# Patient Record
Sex: Female | Born: 1935 | Race: White | Hispanic: No
Health system: Southern US, Community
[De-identification: ages and names within clinical notes are randomized; demographics above are authoritative.]

---

## 1998-12-02 ENCOUNTER — Ambulatory Visit (HOSPITAL_COMMUNITY): Admission: RE | Admit: 1998-12-02 | Discharge: 1998-12-02 | Payer: Self-pay | Admitting: Internal Medicine

## 2000-11-17 ENCOUNTER — Encounter: Payer: Self-pay | Admitting: Orthopaedic Surgery

## 2000-11-17 ENCOUNTER — Encounter: Admission: RE | Admit: 2000-11-17 | Discharge: 2000-11-17 | Payer: Self-pay | Admitting: Orthopaedic Surgery

## 2000-12-01 ENCOUNTER — Encounter: Payer: Self-pay | Admitting: Orthopaedic Surgery

## 2000-12-01 ENCOUNTER — Encounter: Admission: RE | Admit: 2000-12-01 | Discharge: 2000-12-01 | Payer: Self-pay | Admitting: Orthopaedic Surgery

## 2000-12-16 ENCOUNTER — Encounter: Payer: Self-pay | Admitting: Orthopaedic Surgery

## 2000-12-16 ENCOUNTER — Encounter: Admission: RE | Admit: 2000-12-16 | Discharge: 2000-12-16 | Payer: Self-pay | Admitting: Orthopaedic Surgery

## 2001-04-21 ENCOUNTER — Ambulatory Visit (HOSPITAL_COMMUNITY): Admission: RE | Admit: 2001-04-21 | Discharge: 2001-04-21 | Payer: Self-pay | Admitting: Internal Medicine

## 2001-07-04 ENCOUNTER — Encounter: Admission: RE | Admit: 2001-07-04 | Discharge: 2001-07-04 | Payer: Self-pay | Admitting: Orthopaedic Surgery

## 2001-07-04 ENCOUNTER — Encounter: Payer: Self-pay | Admitting: Orthopaedic Surgery

## 2001-08-03 ENCOUNTER — Encounter: Admission: RE | Admit: 2001-08-03 | Discharge: 2001-08-03 | Payer: Self-pay | Admitting: Orthopaedic Surgery

## 2001-08-03 ENCOUNTER — Encounter: Payer: Self-pay | Admitting: Orthopaedic Surgery

## 2001-08-18 ENCOUNTER — Encounter: Admission: RE | Admit: 2001-08-18 | Discharge: 2001-08-18 | Payer: Self-pay | Admitting: Orthopaedic Surgery

## 2001-08-18 ENCOUNTER — Encounter: Payer: Self-pay | Admitting: Orthopaedic Surgery

## 2002-01-10 ENCOUNTER — Encounter: Payer: Self-pay | Admitting: Orthopaedic Surgery

## 2002-01-10 ENCOUNTER — Ambulatory Visit: Admission: RE | Admit: 2002-01-10 | Discharge: 2002-01-10 | Payer: Self-pay | Admitting: Orthopaedic Surgery

## 2002-01-16 ENCOUNTER — Inpatient Hospital Stay (HOSPITAL_COMMUNITY): Admission: RE | Admit: 2002-01-16 | Discharge: 2002-01-20 | Payer: Self-pay | Admitting: Orthopaedic Surgery

## 2003-02-19 ENCOUNTER — Encounter: Admission: RE | Admit: 2003-02-19 | Discharge: 2003-02-19 | Payer: Self-pay | Admitting: Orthopaedic Surgery

## 2003-02-19 ENCOUNTER — Encounter: Payer: Self-pay | Admitting: Orthopaedic Surgery

## 2003-02-19 ENCOUNTER — Ambulatory Visit (HOSPITAL_BASED_OUTPATIENT_CLINIC_OR_DEPARTMENT_OTHER): Admission: RE | Admit: 2003-02-19 | Discharge: 2003-02-19 | Payer: Self-pay | Admitting: Orthopaedic Surgery

## 2004-05-16 ENCOUNTER — Encounter: Payer: Self-pay | Admitting: Internal Medicine

## 2004-06-02 ENCOUNTER — Encounter: Payer: Self-pay | Admitting: Orthopaedic Surgery

## 2004-06-16 ENCOUNTER — Encounter: Payer: Self-pay | Admitting: Orthopaedic Surgery

## 2004-06-16 ENCOUNTER — Encounter: Payer: Self-pay | Admitting: Internal Medicine

## 2004-07-16 ENCOUNTER — Encounter: Payer: Self-pay | Admitting: Internal Medicine

## 2004-08-16 ENCOUNTER — Encounter: Payer: Self-pay | Admitting: Internal Medicine

## 2004-09-16 ENCOUNTER — Encounter: Payer: Self-pay | Admitting: Internal Medicine

## 2004-10-14 ENCOUNTER — Encounter: Payer: Self-pay | Admitting: Internal Medicine

## 2004-11-17 ENCOUNTER — Ambulatory Visit: Payer: Self-pay | Admitting: Internal Medicine

## 2004-11-18 ENCOUNTER — Encounter: Payer: Self-pay | Admitting: Internal Medicine

## 2004-12-14 ENCOUNTER — Encounter: Payer: Self-pay | Admitting: Internal Medicine

## 2005-01-11 ENCOUNTER — Other Ambulatory Visit: Payer: Self-pay

## 2005-01-11 ENCOUNTER — Inpatient Hospital Stay: Payer: Self-pay | Admitting: Internal Medicine

## 2005-01-12 ENCOUNTER — Other Ambulatory Visit: Payer: Self-pay

## 2005-01-13 ENCOUNTER — Other Ambulatory Visit: Payer: Self-pay

## 2005-01-14 ENCOUNTER — Encounter: Payer: Self-pay | Admitting: Internal Medicine

## 2005-01-25 ENCOUNTER — Encounter: Admission: RE | Admit: 2005-01-25 | Discharge: 2005-01-25 | Payer: Self-pay | Admitting: Orthopaedic Surgery

## 2005-02-09 ENCOUNTER — Encounter: Admission: RE | Admit: 2005-02-09 | Discharge: 2005-02-09 | Payer: Self-pay | Admitting: Orthopaedic Surgery

## 2005-02-13 ENCOUNTER — Encounter: Payer: Self-pay | Admitting: Internal Medicine

## 2005-02-25 ENCOUNTER — Encounter: Admission: RE | Admit: 2005-02-25 | Discharge: 2005-02-25 | Payer: Self-pay | Admitting: Orthopaedic Surgery

## 2005-03-16 ENCOUNTER — Encounter: Payer: Self-pay | Admitting: Internal Medicine

## 2005-03-23 ENCOUNTER — Emergency Department: Payer: Self-pay | Admitting: Emergency Medicine

## 2005-03-24 ENCOUNTER — Ambulatory Visit: Payer: Self-pay | Admitting: Internal Medicine

## 2005-04-16 ENCOUNTER — Encounter: Payer: Self-pay | Admitting: Internal Medicine

## 2005-04-27 ENCOUNTER — Ambulatory Visit: Payer: Self-pay | Admitting: Internal Medicine

## 2005-05-16 ENCOUNTER — Encounter: Payer: Self-pay | Admitting: Internal Medicine

## 2005-06-11 ENCOUNTER — Ambulatory Visit: Payer: Self-pay | Admitting: Cardiology

## 2005-06-16 ENCOUNTER — Encounter: Payer: Self-pay | Admitting: Internal Medicine

## 2005-06-18 ENCOUNTER — Ambulatory Visit: Payer: Self-pay | Admitting: Cardiology

## 2005-06-18 ENCOUNTER — Ambulatory Visit: Payer: Self-pay

## 2005-06-25 ENCOUNTER — Ambulatory Visit: Payer: Self-pay | Admitting: Cardiology

## 2005-06-28 ENCOUNTER — Ambulatory Visit: Payer: Self-pay | Admitting: Cardiology

## 2005-06-28 ENCOUNTER — Encounter: Payer: Self-pay | Admitting: Cardiology

## 2005-06-28 ENCOUNTER — Ambulatory Visit (HOSPITAL_COMMUNITY): Admission: RE | Admit: 2005-06-28 | Discharge: 2005-06-28 | Payer: Self-pay | Admitting: Cardiology

## 2005-07-03 ENCOUNTER — Inpatient Hospital Stay (HOSPITAL_COMMUNITY): Admission: RE | Admit: 2005-07-03 | Discharge: 2005-07-05 | Payer: Self-pay | Admitting: Internal Medicine

## 2005-07-12 ENCOUNTER — Ambulatory Visit: Payer: Self-pay | Admitting: Cardiology

## 2005-07-16 ENCOUNTER — Encounter: Payer: Self-pay | Admitting: Internal Medicine

## 2005-08-02 ENCOUNTER — Ambulatory Visit: Payer: Self-pay | Admitting: Cardiology

## 2005-08-16 ENCOUNTER — Encounter: Payer: Self-pay | Admitting: Internal Medicine

## 2005-09-16 ENCOUNTER — Encounter: Payer: Self-pay | Admitting: Internal Medicine

## 2005-10-01 ENCOUNTER — Ambulatory Visit: Payer: Self-pay | Admitting: Cardiology

## 2005-10-14 ENCOUNTER — Encounter: Payer: Self-pay | Admitting: Internal Medicine

## 2005-11-14 ENCOUNTER — Encounter: Payer: Self-pay | Admitting: Internal Medicine

## 2005-12-14 ENCOUNTER — Encounter: Payer: Self-pay | Admitting: Internal Medicine

## 2006-01-14 ENCOUNTER — Encounter: Payer: Self-pay | Admitting: Internal Medicine

## 2006-01-31 ENCOUNTER — Ambulatory Visit: Payer: Self-pay | Admitting: Internal Medicine

## 2006-02-13 ENCOUNTER — Encounter: Payer: Self-pay | Admitting: Internal Medicine

## 2006-03-01 ENCOUNTER — Ambulatory Visit: Payer: Self-pay | Admitting: Cardiology

## 2006-03-16 ENCOUNTER — Encounter: Payer: Self-pay | Admitting: Internal Medicine

## 2006-04-13 ENCOUNTER — Ambulatory Visit: Payer: Self-pay | Admitting: Unknown Physician Specialty

## 2006-04-16 ENCOUNTER — Encounter: Payer: Self-pay | Admitting: Internal Medicine

## 2006-05-16 ENCOUNTER — Encounter: Payer: Self-pay | Admitting: Internal Medicine

## 2006-06-16 ENCOUNTER — Encounter: Payer: Self-pay | Admitting: Internal Medicine

## 2007-01-23 ENCOUNTER — Ambulatory Visit: Payer: Self-pay | Admitting: Internal Medicine

## 2007-03-01 ENCOUNTER — Ambulatory Visit: Payer: Self-pay | Admitting: Internal Medicine

## 2007-04-03 ENCOUNTER — Ambulatory Visit: Payer: Self-pay | Admitting: Internal Medicine

## 2007-06-30 ENCOUNTER — Ambulatory Visit: Payer: Self-pay | Admitting: Unknown Physician Specialty

## 2007-11-06 ENCOUNTER — Ambulatory Visit: Payer: Self-pay | Admitting: Internal Medicine

## 2007-12-06 ENCOUNTER — Ambulatory Visit: Payer: Self-pay | Admitting: Internal Medicine

## 2007-12-06 DIAGNOSIS — E0590 Thyrotoxicosis, unspecified without thyrotoxic crisis or storm: Secondary | ICD-10-CM | POA: Insufficient documentation

## 2007-12-06 DIAGNOSIS — R0602 Shortness of breath: Secondary | ICD-10-CM | POA: Insufficient documentation

## 2007-12-06 DIAGNOSIS — I10 Essential (primary) hypertension: Secondary | ICD-10-CM | POA: Insufficient documentation

## 2007-12-08 ENCOUNTER — Ambulatory Visit: Payer: Self-pay | Admitting: Internal Medicine

## 2007-12-20 ENCOUNTER — Ambulatory Visit: Payer: Self-pay | Admitting: Internal Medicine

## 2007-12-22 ENCOUNTER — Telehealth (INDEPENDENT_AMBULATORY_CARE_PROVIDER_SITE_OTHER): Payer: Self-pay | Admitting: *Deleted

## 2007-12-30 ENCOUNTER — Emergency Department: Payer: Self-pay | Admitting: Emergency Medicine

## 2007-12-30 ENCOUNTER — Other Ambulatory Visit: Payer: Self-pay

## 2008-01-02 ENCOUNTER — Telehealth (INDEPENDENT_AMBULATORY_CARE_PROVIDER_SITE_OTHER): Payer: Self-pay | Admitting: *Deleted

## 2008-01-05 ENCOUNTER — Ambulatory Visit: Payer: Self-pay | Admitting: Internal Medicine

## 2008-01-05 DIAGNOSIS — R000 Tachycardia, unspecified: Secondary | ICD-10-CM | POA: Insufficient documentation

## 2008-01-09 ENCOUNTER — Telehealth (INDEPENDENT_AMBULATORY_CARE_PROVIDER_SITE_OTHER): Payer: Self-pay | Admitting: *Deleted

## 2008-01-10 ENCOUNTER — Inpatient Hospital Stay: Payer: Self-pay | Admitting: Internal Medicine

## 2008-01-11 ENCOUNTER — Encounter: Payer: Self-pay | Admitting: Internal Medicine

## 2008-01-15 ENCOUNTER — Ambulatory Visit: Payer: Self-pay | Admitting: Internal Medicine

## 2008-01-15 ENCOUNTER — Inpatient Hospital Stay: Payer: Self-pay | Admitting: Internal Medicine

## 2008-02-26 ENCOUNTER — Ambulatory Visit: Payer: Self-pay | Admitting: Family

## 2008-11-21 ENCOUNTER — Ambulatory Visit: Payer: Self-pay | Admitting: Family

## 2009-08-21 ENCOUNTER — Ambulatory Visit: Payer: Self-pay | Admitting: Internal Medicine

## 2009-09-08 ENCOUNTER — Ambulatory Visit: Payer: Self-pay | Admitting: Internal Medicine

## 2009-12-12 ENCOUNTER — Ambulatory Visit: Payer: Self-pay | Admitting: Internal Medicine

## 2010-01-03 IMAGING — CT CT CHEST W/O CM
3 of 5 series · 17 of 36 positions shown, 19 images · non-contrast
Comparison: 07/12/2005

CLINICAL DATA: Shortness of breath, rule out pulmonary fibrosis

CT CHEST WITHOUT CONTRAST
TECHNIQUE: Multidetector CT imaging of the chest was performed
following the standard protocol without IV contrast.

[Series 2: hires chest 5.0 b40f st · axial · 0.73mm/px · z∈[-238,-28]mm · 8 of 55 slices shown, 10 images]
[im 7/55  mediastinal]
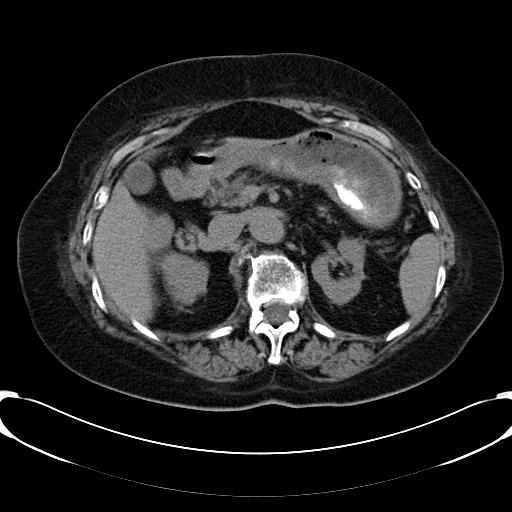
[im 7/55  lung]
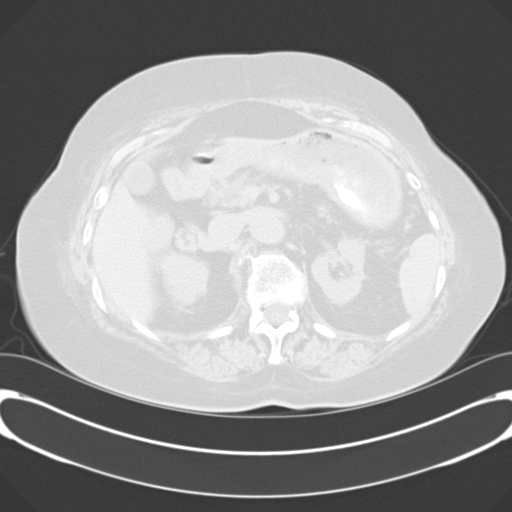
[im 13/55  lung]
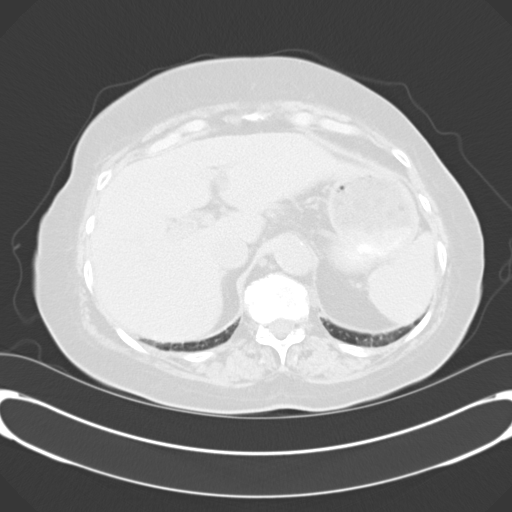
[im 19/55  lung]
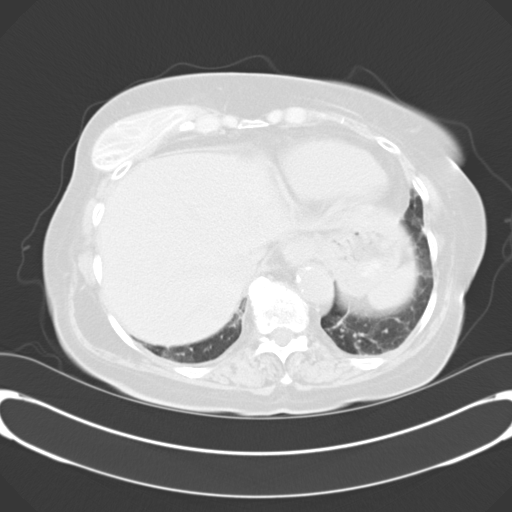
[im 25/55  lung]
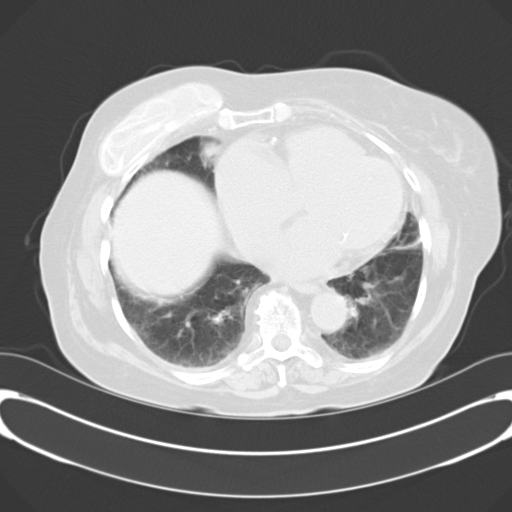
[im 31/55  mediastinal]
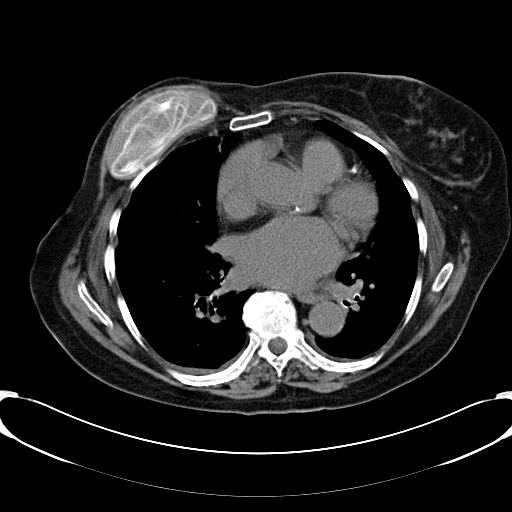
[im 31/55  lung]
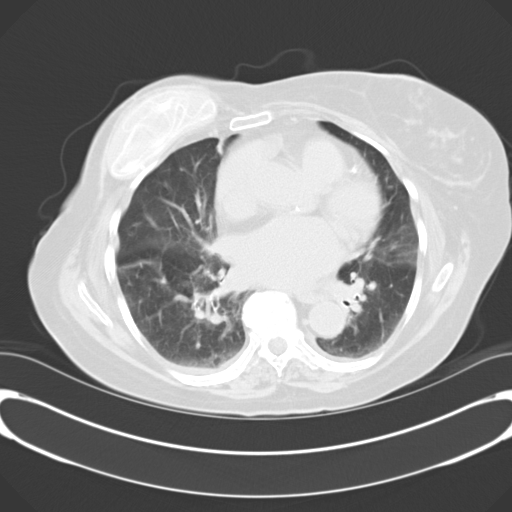
[im 37/55  lung]
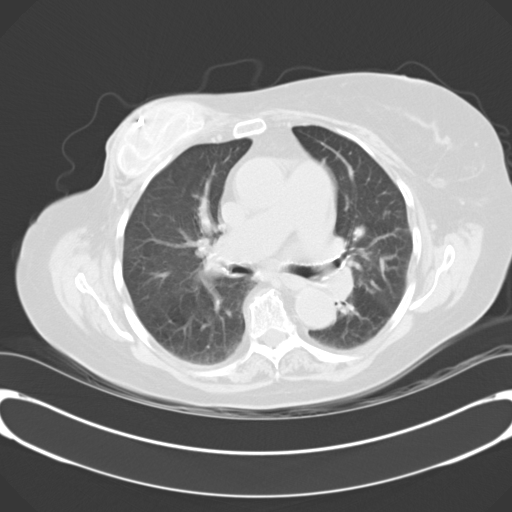
[im 43/55  lung]
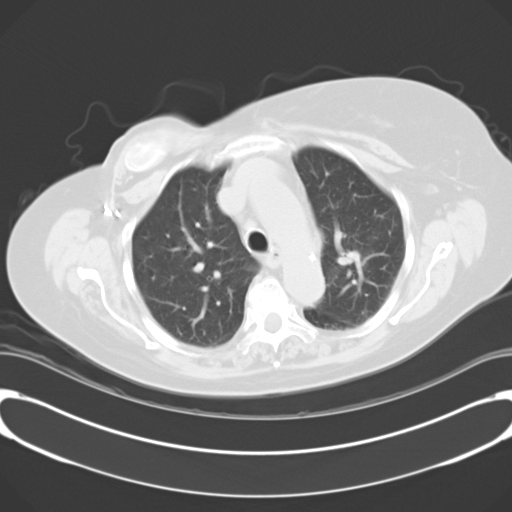
[im 49/55  lung]
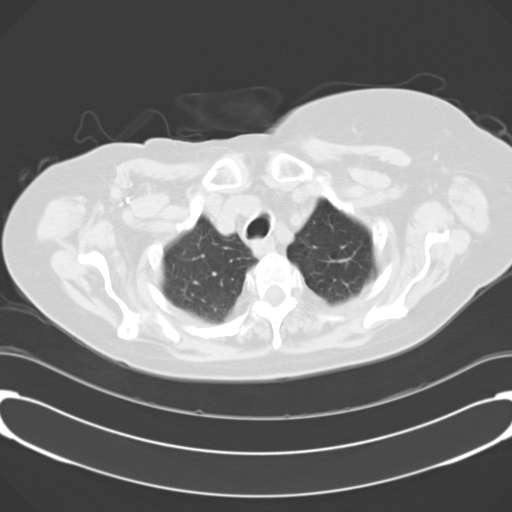

[Series 3: hires chest 5.0 b70f lung · axial · 0.73mm/px · z∈[-234,-68]mm · 6 of 54 slices shown]
[im 7/54  lung]
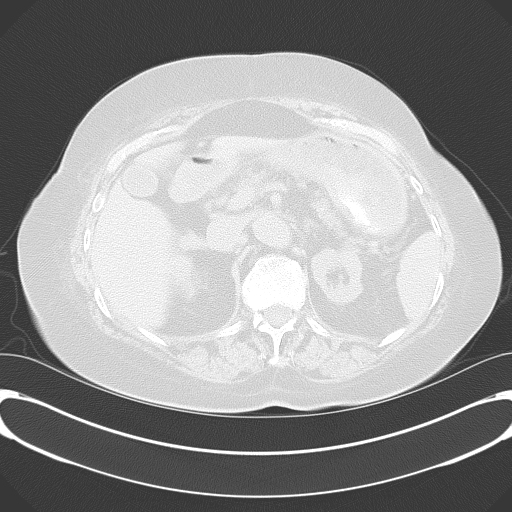
[im 14/54  lung]
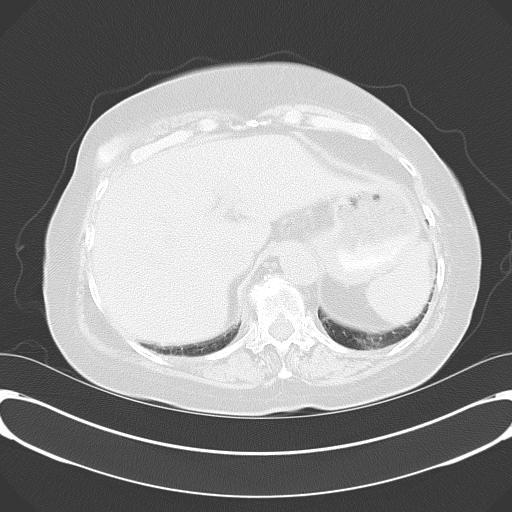
[im 20/54  lung]
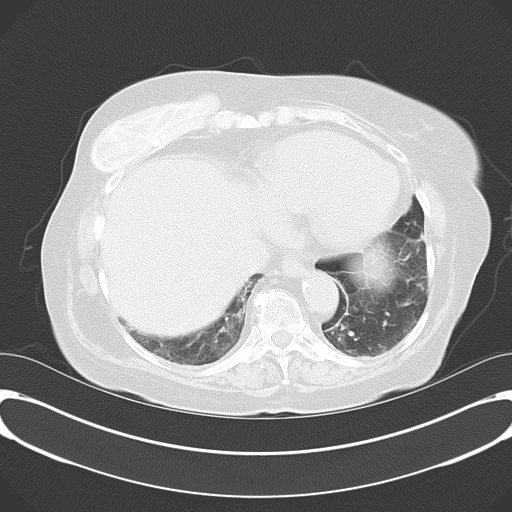
[im 27/54  lung]
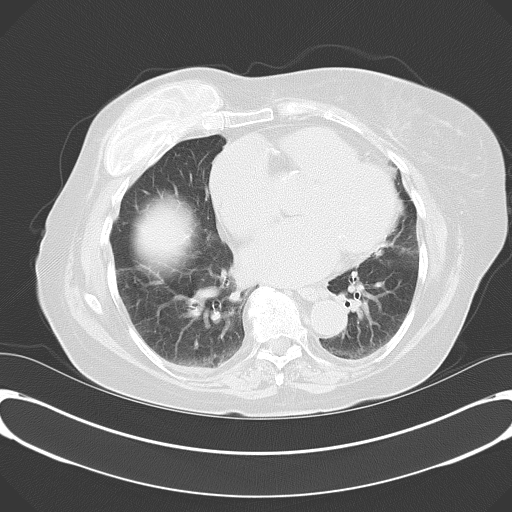
[im 34/54  lung]
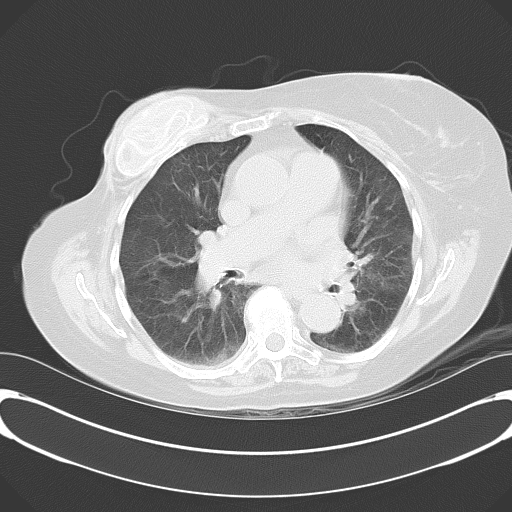
[im 40/54  lung]
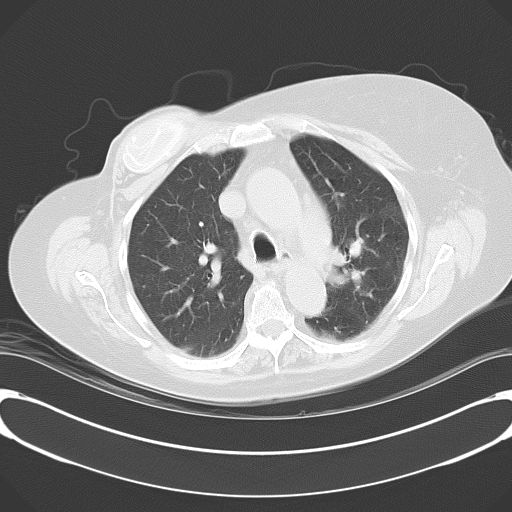

[Series 602: <mpr thick range> · coronal · 0.73mm/px · 3 of 125 slices shown]
[im 25/125  lung]
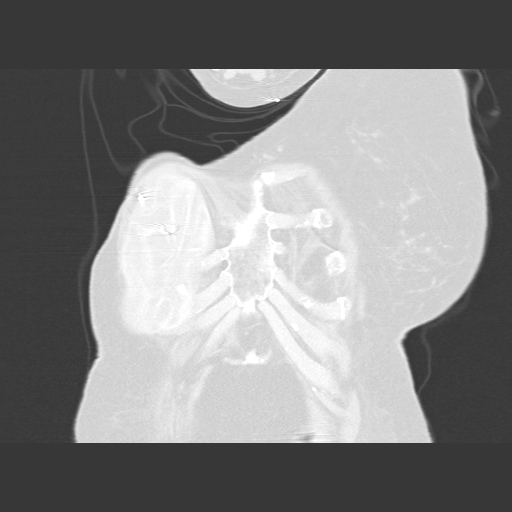
[im 50/125  lung]
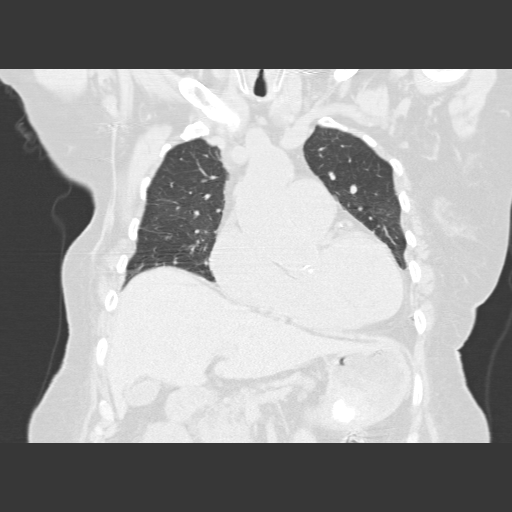
[im 75/125  lung]
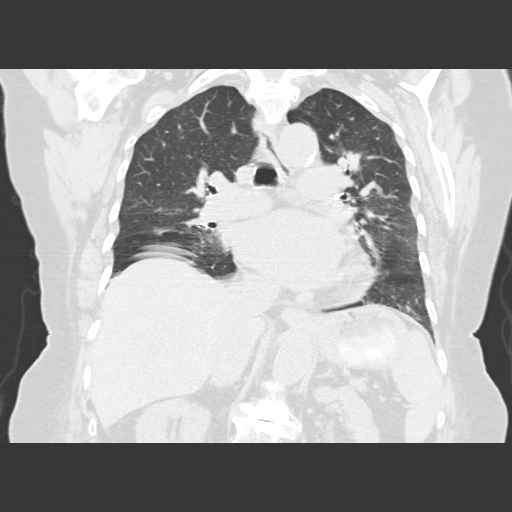

[17 of 36 positions shown; findings below may reference images not displayed]

FINDINGS: Respiratory motion somewhat obscures high resolution
images.  No well-defined fibrosis at this time.  There are
scattered areas of ground-glass densities in the posterior lingula
and in the posterior lower lobes bilaterally, possibly atelectasis.
No effusions.  No confluent opacities or nodules.

Heart is mildly enlarged.  Dense coronary artery calcifications
present.  Aorta is nondilated.

No mediastinal, hilar, or axillary adenopathy.  The patient is
status post right mastectomy and breast reconstruction and right
axillary lymph node dissection.

Imaging into the upper abdomen shows no acute findings.

Degenerate change in the thoracic spine.  No acute findings.
IMPRESSION: High resolution images somewhat obscured by respiratory motion.
Scattered predominately dependent ground-glass densities which I
suspect are likely related to atelectasis.

Status post right mastectomy and breast reconstruction and axillary
nodal dissection.

Cardiomegaly.  Coronary artery disease.

## 2010-03-24 ENCOUNTER — Encounter: Admission: RE | Admit: 2010-03-24 | Discharge: 2010-03-24 | Payer: Self-pay | Admitting: Internal Medicine

## 2010-03-31 ENCOUNTER — Ambulatory Visit: Payer: Self-pay | Admitting: Internal Medicine

## 2010-04-07 ENCOUNTER — Encounter: Admission: RE | Admit: 2010-04-07 | Discharge: 2010-04-07 | Payer: Self-pay | Admitting: Internal Medicine

## 2010-04-16 ENCOUNTER — Ambulatory Visit: Payer: Self-pay | Admitting: Internal Medicine

## 2010-04-30 ENCOUNTER — Ambulatory Visit: Payer: Self-pay | Admitting: Internal Medicine

## 2010-05-04 ENCOUNTER — Ambulatory Visit: Payer: Self-pay | Admitting: Internal Medicine

## 2010-05-16 ENCOUNTER — Emergency Department: Payer: Self-pay | Admitting: Emergency Medicine

## 2010-05-16 ENCOUNTER — Ambulatory Visit: Payer: Self-pay | Admitting: Internal Medicine

## 2010-05-18 ENCOUNTER — Ambulatory Visit: Payer: Self-pay | Admitting: Internal Medicine

## 2010-05-18 LAB — CANCER ANTIGEN 27.29: CA 27.29: 31.2 U/mL (ref 0.0–38.6)

## 2010-05-18 LAB — CEA: CEA: 2.6 ng/mL (ref 0.0–4.7)

## 2010-05-20 LAB — PROT IMMUNOELECTROPHORES(ARMC)

## 2010-06-16 ENCOUNTER — Ambulatory Visit: Payer: Self-pay | Admitting: Internal Medicine

## 2010-07-16 ENCOUNTER — Ambulatory Visit: Payer: Self-pay | Admitting: Internal Medicine

## 2011-01-01 NOTE — Assessment & Plan Note (Signed)
[facility]                            [facility] OFFICE NOTE   NAME:[name] [name] GRINNELL                   MRN:          [id number]  DATE:03/01/2006                            DOB:          November 06, 1919    HISTORY OF PRESENT ILLNESS:  Patient is an 77 year old female with a history  of moderately severe pulmonary hypertension, likely secondary to significant  mitral regurgitation.  The patient presents for followup.  Overall, she has  been doing well.  She reports no symptoms at rest.  She continues to have,  however, significant exercise tolerance, and she remains in NYHA class IIB.  She denies any substernal chest pain, orthopnea, or PND.  She has no  evidence of volume overload.  The patient does have a history of ectopic  atrial rhythm.  On physical exam, she does appear to have a regular rhythm.   MEDICATIONS:  1.  Klor-Con daily.  2.  Toprol XL 25 mg a day.  3.  Warfarin as directed.  4.  Lasix 20 mg a day.  5.  Citalopram 10 mg p.r.n.  6.  Omega fish oil.  7.  Vitamin B.   PHYSICAL EXAMINATION:  VITAL SIGNS:  Blood pressure is 102/60, heart rate 60  beats per minute.  Weight is 168 pounds.  NECK:  Normal carotid upstroke.  No carotid bruits.  LUNGS:  Clear breath sounds bilaterally.  HEART:  Regular rate and rhythm.  Normal sinus.  There is a soft murmur at  the apex, 2/6.  EXTREMITIES:  No clubbing, cyanosis or edema.   PROBLEMS:  1.  Pulmonary hypertension.      1.  Exercise intolerance, New York Heart classification IIB.      2.  Pulmonary arterial hypertension with pulmonary vascular resistance          of 8 Wood units, possibly secondary to reflex vasoconstriction and          mitral regurgitation.  2.  History of pulmonary emboli.  3.  Obstructive lung disease with evidence of pulmonary fibrosis on the      prior CT scan but none more recently.  4.  Atrial fibrillation and ectopic atrial rhythm.  5.  Coronary artery  disease.      1.  Status post two vessel coronary artery disease, status post bare          metal stent on the right coronary artery.      2.  Mild left ventricular dysfunction.      3.  Ejection fraction 40-50%.  6.  Moderately severe mitral regurgitation.   PLAN:  At this point, we will continue medical strategy.  The patient  previously has indicated that she does not want any further surgical  intervention for her mitral regurgitation.  Clinically, she does not appear  to be in heart failure.  We will do an EKG today to see if she remains in  ectopic atrial rhythm.  She will be continued on Coumadin.  I do not think  she needs additional diuretics at this point in time.  We will plan on  a  repeat echocardiographic study in six months.                                   Learta Codding, MD, [facility]   GED/MedQ  DD:  03/01/2006  DT:  03/01/2006  Job #:  [id number]

## 2011-01-01 NOTE — Cardiovascular Report (Signed)
[name], [name]            ACCOUNT NO.:  [id number]   MEDICAL RECORD NO.:  [id number]          PATIENT TYPE:  OIB   LOCATION:  6532                         FACILITY:  [facility]   PHYSICIAN:  Arvilla Meres, M.D. LHCDATE OF BIRTH:  1920/03/18   DATE OF PROCEDURE:  DATE OF DISCHARGE:                              CARDIAC CATHETERIZATION   PRIMARY CARE PHYSICIAN:  Dr. Juel Burrow.   CARDIOLOGIST:  Dr. Andee Lineman.   PATIENT IDENTIFICATION:  [name] is a delightful 77 year old woman with  a history of pulmonary arterial hypertension as well as previous pulmonary  emboli who was treated in the past by Dr. Carolee Rota in the Pulmonary  Hypertension Clinic at [facility]; however, she has never had a right heart  catheterization. She has refused this in the past. Over the past several  months, she has had increasing severe dyspnea. She was evaluated by Dr.  Andee Lineman. Echocardiogram suggested moderate to severe mitral regurgitation  with elevated pulmonary pressures in the 70 mmHg range. She denied any chest  pain. She recently underwent a transesophageal echocardiogram which showed  normal LV function with 3+ mitral regurgitation. There was also mild to  moderate RV enlargement and RV dysfunction. She was referred for cardiac  catheterization to further evaluate her pulmonary hypertension as well as  her coronary anatomy. Of note, she also recently developed atrial  fibrillation and has been on anticoagulation for this. This has been held  prior to catheterization.   PROCEDURES PERFORMED:  1.  Right heart cath with Fick cardiac output.  2.  Pulmonary artery angiography.  3.  Left heart cath.  4.  Left ventriculogram.   DESCRIPTION OF PROCEDURE:  The risks and benefits of catheterization were  explained to [name]. Consent was signed and placed on the chart. A 6-  French arterial sheath was placed in the right femoral artery. Standard  catheters including a preformed Judkins JL-4, JR-4,  and angled pigtail were  used for the procedure. A 7-French venous sheath was placed in the right  femoral vein using a modified Seldinger technique. Initially we tried to use  a standard Swan catheter; however, we were unable to advance this into the  pulmonary artery even with the help of 2.5 guide wire. We then switched out  for an [employer]. With this catheter, we were able to advance easily  into the pulmonary artery with the help of a wire. However, we had great  amount of difficulty advancing into the distal pulmonary arterial bed. We  took pulmonary arteriogram to help locate the pulmonary arteries and these  were not well opacified. Finally after multiple attempts, we were able to  locate the mid-pulmonary artery on the left side. The catheter was fed,  however, would not advance very far. We were able to achieve a nice wedge  tracing. There were no apparent complications.   FINDINGS:  Central aortic pressure was 164/70 with a mean of 107. LV  pressure was 167/10 with LVEDP of 18. Simultaneous tracings of her LV and  wedge pressure did not reveal any mitral stenosis. There was evidence of  mitral regurgitation. There  was no aortic stenosis on pullback across the  aortic valve.   Right atrial pressure mean of 10, RV pressure 60/3, PA pressure 63/22 with a  mean of 38. Pulmonary capillary wedge pressure was 19 with V-waves of 30-35.  Pulmonary artery saturation was checked twice. It was 59% and 55%. Femoral  artery saturation was 89%. Fick cardiac outputs 2.4 liters per minute. Fick  cardiac index was 1.3 liters per minute per meter squared. Pulmonary  vascular resistance was 7.9 Woods units.   CORONARY ANATOMY:  Left main had a 20% ostial lesion.   LAD was a moderate-sized vessel coursing to the apex. It gave off one  moderate-sized diagonal. There was a 40% proximal lesion in the LAD followed  by a 40% lesion just prior to the takeoff of the first diagonal. After the   first diagonal, there was a tubular 70% lesion in the mid-LAD followed by a  50% lesion. There was a diffuse moderate disease in the distal LAD.   Left circumflex was a large vessel. It gave off a large OM-1. The distal AV  groove circumflex was small. There was a 30% lesion in the circumflex just  prior to the takeoff of the OM-1. In the proximal portion of the OM-1 there  was a 20-30% stenosis.   Right coronary artery was a large dominant vessel. It gave off a large PDA  and a small PL. In the ostial RCA, there was a 20% lesion followed by a high-  grade 80-90% lesion proximally. There was a 20-30% tubular lesion in the mid-  section followed by a 40% lesion around the bend and then a 20-30% lesion  distally prior to the takeoff of the PDA. PDA was free of significant  disease.   Left ventriculogram showed an EF of 55%, although this was difficult due to  evaluate in the setting of atrial fibrillation. There was apparent 1+ mitral  regurgitation.   ASSESSMENT:  1.  Significant one-vessel coronary disease with high-grade lesion in the      right coronary artery.  2.  There was a borderline lesion in the left anterior descending artery      which does not appear flow-limiting.  3.  There was normal left ventricular function.  4.  There was 1+ mitral regurgitation by catheterization, 3+ mitral      regurgitation by transesophageal echocardiogram.  5.  Moderate pulmonary hypertension with mild increase in the pulmonary      capillary wedge pressure with V-waves. There was also evidence of      significant pulmonary arterial hypertension with elevated pulmonary      vascular resistance.  6.  Severely reduced cardiac output secondary to possible right ventricular      dysfunction, although this seems a bit underestimated.   PLAN/DISCUSSION:  At this point, I am unclear about how much her mitral  regurgitation is actually contributing to her symptoms. She also has a significant  component of pulmonary arterial hypertension with an elevated  pulmonary vascular resistance. I had a long talk with [name] regarding  the possible options and she is quite adamant that she is not interested in  a CABG or mitral valve replacement. I have reviewed the case with Dr. Juanda Chance  and we will plan for percutaneous intervention on the RCA with aggressive  medical therapy including digoxin afterload reduction and possible pulmonary  arterial vasodilators to treat her pulmonary hypertension as per Dr. Andee Lineman.      Reuel Boom  Bensimhon, M.D. [facility]  Electronically Signed     DB/MEDQ  D:  07/02/2005  T:  07/03/2005  Job:  [id number]   cc:   Juel Burrow, M.D.   Learta Codding, M.D. [facility]  [address]  [address]  [place]  [place] [address]

## 2011-01-01 NOTE — Discharge Summary (Signed)
[place]  Patient:    [name], [name] Visit Number: [id number] MRN: [id number]          Service Type: SUR Location: [id number] Attending Physician:  Marcene Corning Dictated by:   Prince Rome, P.A. Admit Date:  01/16/2002 Disc. Date: 01/20/02                             Discharge Summary  ADMITTING DIAGNOSES: 1. End-stage degenerative joint disease, left hip. 2. History of pulmonary embolus. 3. Atrial fibrillation.  DISCHARGE DIAGNOSES: 1. End-stage degenerative joint disease, left hip. 2. History of pulmonary embolus. 3. Atrial fibrillation.  PROCEDURE:  Left total hip replacement.  HISTORY OF PRESENT ILLNESS:  Rwanda is an 77 year old, white female patient well-known to our practice who we have been following for left hip pain.  On x-ray, she has end-stage bone on bone DJD in her hip and is having pain with every step and is having nighttime pain.  She had her right hip replaced some time ago and it has done well.  We have discussed treatment options with her, those being total hip replacement on the left side.  LABORATORY DATA AND X-RAY FINDINGS:  On her last testing, her hemoglobin was 8.6, hematocrit 25.2.  INRs being regulated as she was on low dose Coumadin protocol per pharmacy.  Other lab work is available in medical records.  Chest x-ray with no active disease.  HOSPITAL COURSE:  The patient was admitted after surgery and put onto a regular orthopedic floor on a variety of p.o. and IM analgesics for pain with IV Ancef 1 q.8h. x3 doses.  Pharmacy was ordered for low dose Coumadin DVT prophylaxis to run an INR between 2-3.  Physical therapy also ordered her to be full weightbearing as tolerated.  She progressed well.  Her dressing was changed on postop day #2 and her drain was pulled.  She had good neurovascular status to her toes with no sign of irritation or infection.  She was discharged to [facility]  in [place] which is a short-term rehabilitation.  CONDITION ON DISCHARGE:  Improved.  DISCHARGE MEDICATIONS: 1. She will remain on Coumadin probably for some time according to Dr. Juel Burrow,    her cardiologist for atrial fibrillation, for the purposes of her total hip    replacement for four weeks to run an INR between 2-3. 2. Tiazac 180 mg one p.o. q.d. 3. Percocet for pain one or two q.4-6h. p.r.n. pain.  FOLLOWUP:  She will return to our office in 10 days and have her staples taken out and for x-rays.  ACTIVITY:  Weightbearing as tolerated.  Work with physical therapy.  SPECIAL INSTRUCTIONS:  If any problems arise, she is to call us at [phone]. Dictated by:   Prince Rome, P.A. Attending Physician:  Marcene Corning DD:  01/19/02 TD:  01/19/02 Job: [id number]

## 2011-01-01 NOTE — Cardiovascular Report (Signed)
NAME:  [name], [name]            ACCOUNT NO.:  [id number]   MEDICAL RECORD NO.:  [id number]          PATIENT TYPE:  OIB   LOCATION:  4735                         FACILITY:  [facility]   PHYSICIAN:  Charlies Constable, M.D. [facility] DATE OF BIRTH:  1920-05-22   DATE OF PROCEDURE:  07/02/2005  DATE OF DISCHARGE:                              CARDIAC CATHETERIZATION   PERCUTANEOUS CORONARY INTERVENTION NOTE   CLINICAL HISTORY:  Mrs. Hum is 77 years old and has been bothered by  symptoms of shortness of breath.  She was evaluated by catheterization today  by Dr. Gala Romney and was found to have significant pulmonary hypertension,  moderate mitral regurgitation and normal left ventricular function.  She had  previously had an echocardiogram which showed moderately severe mitral  regurgitation.  She also has significant coronary artery disease with 70%  narrowing in the LAD and 90% narrowing in the right coronary artery.  She  has exertional dyspnea but no chest pain.  After reviewing the data with Dr.  Gala Romney, we felt that PCI of the right coronary artery was indicated.  She  also has chronic atrial fibrillation.   DESCRIPTION OF PROCEDURE:  The procedure was performed via the right femoral  artery and arterial sheath and a 6-French JR-4 guiding catheter with side  holes.  We crossed the lesion in the proximal right coronary artery with a  Prowater wire without difficulty.  The patient had been given Angiomax bolus  and infusion and 600 mg of Plavix and 20 mg of Pepcid.  We direct stented  with a 3.5 x 12 mm Liberte stent, deploying this with one inflation at 15  atmospheres x30 seconds.  We then post dilated with a 3.75 x 8 mm Quantum  Maverick with one inflation up to 15 atmospheres x30 seconds.   FINAL DIAGNOSIS:  __________ with the guiding catheter.   We chose the bare metal stent because the patient has chronic atrial  fibrillation and because the caliber of the vessel was long and the  lesion  was short.   The patient tolerated the procedure well and left the laboratory in  satisfactory condition.   RESULTS:  Initially the stenosis in the proximal right coronary artery was  estimated at 90%.  Following stenting this improved to 0%.   CONCLUSION:  Successful percutaneous coronary intervention of the lesion in  the proximal olecranon using a bare metal stent was improvement of narrowing  from 90% to 0%.   DISPOSITION:  The patient returned to ___________ for further observation.  He is to remain on Plavix for at least a month, probably concomitant with  Coumadin.  She will be on long-term Coumadin therapy.           ______________________________  Charlies Constable, M.D. [facility]     BB/MEDQ  D:  07/02/2005  T:  07/03/2005  Job:  [id number]   cc:   Learta Codding, M.D. [facility]  [address]  [address]  [place]  [place] [address]   Dr. Cleatis Polka, [place], [place]

## 2011-01-01 NOTE — Op Note (Signed)
NAME:  [name], [name]                      ACCOUNT NO.:  [id number]   MEDICAL RECORD NO.:  [id number]                   PATIENT TYPE:  AMB   LOCATION:  DSC                                  FACILITY:  [facility]   PHYSICIAN:  Lubertha Basque. Jerl Santos, M.D.             DATE OF BIRTH:  February 27, 1920   DATE OF PROCEDURE:  02/19/2003  DATE OF DISCHARGE:                                 OPERATIVE REPORT   PREOPERATIVE DIAGNOSES:  1. Right knee torn medial meniscus.  2. Right knee degenerative joint disease.   POSTOPERATIVE DIAGNOSES:  1. Right torn medial and torn lateral meniscus.  2. Right knee degenerative joint disease.   PROCEDURES:  1. Right knee partial and partial lateral meniscectomies.  2. Right knee chondroplasty, all 3 compartments.   ANESTHESIA:  General.   SURGEON:  Lubertha Basque. Jerl Santos, M.D.   ASSISTANT:  Lindwood Qua, P.A.   INDICATIONS FOR PROCEDURE:  The patient is an 77 year old woman with a  history of  intense right knee pain. This has persisted despite oral anti-  inflammatories and several injections, all of which do afford her transient  relief. She has recurrent pain at rest  and pain with activity and was  offered an arthroscopy. She is known to have some degenerative findings on x-  rays, and is status post  a successful  hip replacement. Informed operative  consent was obtained after a discussion of the possible complications of  reaction to anesthesia, infection, deep venous thrombosis, pulmonary  embolism and death.   DESCRIPTION OF PROCEDURE:  The patient was taken to the operating suite  where general anesthesia was applied without difficulty. She was positioned  supine and prepped and draped in the usual sterile fashion.   After administration of preoperative IV antibiotics, an arthroscopy of the  right knee was performed through 2 inferior portals. The suprapatellar pouch  was benign although the patellofemoral joint exhibited grade 4 change with  significant erosion of the patella into the intratrochlear groove. She  actually had 2 grooves in the intratrochlear area rather than 1. She had  significant grade 4 change.   Some flaps of articular cartilage from the outer edges of this joint were  debrided. The medial compartment exhibited some grade 3 chondromalacia with  a medial meniscus tear involving the middle and posterior horns. About a 10%  partial medial meniscectomy was done back to stable tissues. The ACL  appeared to be intact. In the lateral compartment she had a smaller  meniscal tear addressed with a 5% partial lateral meniscectomy back to  stable tissues. She also had grade 3 change in this compartment addressed  with a thorough chondroplasty.   The knee was thoroughly irrigated at the end of the case followed by the  placement of Marcaine with epinephrine and morphine. Adaptic was placed over  the portals followed  by dry gauze and a loose Ace wrap. Estimated blood  loss was  and IV fluids can be obtained from anesthesia records.   The patient was extubated in the operating room and taken to the recovery  room in stable condition. Plans were her for her to go home the same day and  follow up in the office in one week. I will contact her by phone tonight.                                               Lubertha Basque Jerl Santos, M.D.    PGD/MEDQ  D:  02/19/2003  T:  02/19/2003  Job:  [id number]

## 2011-01-01 NOTE — Discharge Summary (Signed)
[name], [name]            ACCOUNT NO.:  [id number]   MEDICAL RECORD NO.:  [id number]          PATIENT TYPE:  INP   LOCATION:  4730                         FACILITY:  [facility]   PHYSICIAN:  Learta Codding, M.D. LHCDATE OF BIRTH:  [birth year]   DATE OF ADMISSION:  07/02/2005  DATE OF DISCHARGE:  07/05/2005                           DISCHARGE SUMMARY - REFERRING   DISCHARGE DIAGNOSES:  1.  Pulmonary hypertension/severe dyspnea, New York Heart Association Class      III congestive heart failure, moderate to severe mitral regurgitation      with increased left atrial pressure, reflux vasoconstriction.  2.  History of pulmonary emboli.  3.  History of restrictive lung disease.  4.  Atrial fibrillation with rapid ventricular rate, rate now controlled.  5.  Two vessel coronary artery disease status post bare metal stent to the      right coronary artery with normal left ventricular function.  6.  Chronic atrial fibrillation with Coumadin and anticoagulation.  7.  History as previously.   PROCEDURE PERFORMED:  Cardiac catheterization on July 02, 2005, by Dr.  Gala Romney.  Please refer to dictation.   SUMMARY OF HISTORY:  [name] is an 77 year old white female who was  referred to Dr. Andee Lineman initially on June 11, 2005, with a history of  decreased energy and dyspnea.  An echocardiogram  showed moderate severe MR  with a restricted filling pattern and an EF of 45 to 50%.  With her history  of pulmonary hypertension, it has been followed by Dr. Sherene Sires and Dr. Janee Morn  in the past, she has not undergone a right heart catheterization because of  her refusal secondary to her husband's complications post cardiac  catheterization.  On June 11, 2005, she was found to be in atrial  fibrillation which appeared to be new with her history of pulmonary emboli.  She  was placed on Coumadin anticoagulation.  After her initial visit, she  was placed on digoxin with initial improvement of  her symptoms, however,  returned to the office on June 25, 2005, shows a return of her fatigue.  Holter monitor had showed controlled  heart rates.  Dr. Andee Lineman, with her new  echocardiogram findings, felt that a TEE should be performed.  This was  arranged as an outpatient and it was also felt that she may need a cardiac  catheterization.  Thus, her admission at this time.   LABORATORY DATA:  Chest x-ray on July 02, 2005, shows moderate  cardiomegaly, no edema.  There were foreign bodies in the right chest wall,  presumably related to prior breast surgery.   On July 03, 2005, H&H was 12.7 and 36.5, normal indices.  Platelets 182,  wbc 5.2.  ESR on July 03, 2005, was 23.  Sodium 139, potassium 3.8, BUN  15, creatinine 1.3, glucose 112.  Rheumatoid factor on July 03, 2005,  was less than 20.  Weights were not obtained during her hospitalization.   EKGs during her hospitalization showed atrial fibrillation with a  ventricular rate in the 50s and 60s.   HOSPITAL COURSE:  On July 02, 2005, she  underwent right and left cardiac  catheterization by Dr. Gala Romney.  He noticed significant one vessel disease  with a high grade lesion in the RCA of 80 to 90%, she had borderline lesions  in the LAD with normal LV function and 1+ MR by catheterization.  Felt that  she had moderate pulmonary hypertension with a decreased cardiac output,  question related to RV dysfunction.  Dr. Gala Romney commented that it was  unclear how much MR was contributing to her symptoms.  He also noted  significant component of pulmonary arterial hypertension.  Patient is  refusing CABG or mitral valve replacement evaluation.  Findings were  reviewed with Dr. Juanda Chance and angioplasty of RCA was discussed.  Felt that  she should undergo aggressive medical treatment with afterload reduction,  possible vasodilators per Dr. Andee Lineman.  Dr. Juanda Chance performed monorail  stenting to the RCA reducing the proximal  lesion from 90 to 0%.  He  recommended Plavix for one month for a bare metal stent.  Dr. Andee Lineman  reviewed the case extensively with Dr. Gala Romney.  He felt that her mitral  valve disease in combination with her left atrial ______ were predominant  cause for her pulmonary hypertension.  He felt that this also may be  reactive vasoconstriction and did not exclude a component of lung disease  related to pulmonary vascular disease.  He began the medication, Revatio,  while in the hospital to follow her for hypotension and decreased O2 sats.  He commented he wished to avoid calcium channel blockers due to decreased  cardiac output.  He felt she was not a candidate for vasodilatation  reactivity testing.  He will check a six minute walk and O2 sats and  nocturnal O2 saturation and resume Coumadin once her Plavix is finished.  MRI was also scheduled to rule out intracardiac shunt in the next few weeks  after her stent recovery.  Her sats by catheterization were 89% and he felt  that she would need home oxygen.  Cardiac rehab assisted her with education  and ambulation.  RT also placed on overnight pulse oximeter study per  physician order.  During her hospitalization she remained in atrial  fibrillation.  It was noted on July 04, 2005, patient wanted to go home  as she was very upset with her room and nursing care.  By July 05, 2005,  patient did not have any complaints of shortness of breath and Dr. Andee Lineman  felt that she could be discharged home. Her overnight sats were 92 to 95%,  however, her sats are usually dropping down to 87%.   DISPOSITION:  Patient is discharged home.  She is asked to maintain low  salt, fat, cholesterol diet.  Her activity and wound care is per  supplemental discharge instruction sheet.  Case management will see her  prior to discharge to assist in arranging home oxygen.   Her new medications include: 1.  Plavix 75 mg daily x30 days.  2.  Revatio 20 mg  t.i.d.  3.  Nitroglycerin 0.4 p.r.n.  4.  Toprol XL 50 mg daily.  5.  Digoxin 0.125 mg daily (her Toprol and digoxin were decreased).  6.  Aspirin 325 mg daily x30 days, then to begin aspirin 81 mg daily      thereafter.  7.  She was instructed to restart her Coumadin 2.5 mg in 30 days when she      has finished her Plavix.  8.  She was asked to continue Lasix 40  mg daily.  9.  K-Dur 20 mEq daily.  10. Celexa 10 mg daily.  11. Advair as previously.   FOLLOW UP:  She will follow up with Dr. Andee Lineman in the [place], [place], office on July 12, 2005, at 2:15 p.m.  At that time a six  minute walk will be performed.  She was also informed that she will need a  PT/INR with Dr. Juel Burrow one week after her Coumadin is resumed.      Joellyn Rued, P.A. [facility]      Learta Codding, M.D. [facility]  Electronically Signed    EW/MEDQ  D:  07/05/2005  T:  07/05/2005  Job:  [id number]   cc:   Chrystie Nose, M.D.  [place], [place]

## 2011-01-01 NOTE — Op Note (Signed)
[place]  Patient:    [name], [name] Visit Number: [id number] MRN: [id number]          Service Type: SUR Location: [facility] 2550 02 Attending Physician:  Marcene Corning Dictated by:   Lubertha Basque. Jerl Santos, M.D. Proc. Date: 01/16/02 Admit Date:  01/16/2002                             Operative Report  PREOPERATIVE DIAGNOSIS:  Left hip degenerative arthritis.  POSTOPERATIVE DIAGNOSIS:  Left hip degenerative arthritis.  OPERATION PERFORMED:  Left total hip replacement.  ANESTHESIA:  General.  ATTENDING SURGEON:  Lubertha Basque. Jerl Santos, M.D.  ASSISTANT:  Lindwood Qua, P.A.  INDICATIONS FOR PROCEDURE:  The patient is an 77 year old woman who is several years out from a successful right hip replacement.  She has been left with terrible pain on the left.  This has persisted despite various oral anti-inflammatories.  She has also been through a program of injections about her back in hopes that maybe this was referred.  By x-ray she has moderate to severe degenerative change.  She is offered a hip replacement operation. Informed operative consent was obtained after discussion of possible complications of reaction to anesthesia, infection, dislocation, DVT, PE, and death.  The risks were elevated considering she also has trouble with atrial fibrillation which is reasonably controlled by her cardiologist.  She has accepted these risks and also would like to make herself a DNR.  DESCRIPTION OF PROCEDURE:  The patient was taken to an operating suite where general anesthetic was applied without difficulty.  She was then positioned in lateral decubitus position.  Hip positioners were used and an axillary roll was placed.  She was prepped and draped in normal sterile fashion.  After administration of preop intravenous antibiotics, a posterior approach was taken to the left hip.  All appropriate anti-infective measures were used including closed hooded  exhaust systems for each member of the surgical team, preop intravenous antibiotics, and Betadine impregnated drape.  She had an abundance of adipose tissue which was probably three inches thick over the greater trochanter.  Dissection was carried down to the fascia lata and gluteus maximus fascia which were incised longitudinally.  The short external rotators were tagged and reflected.  She had severe degenerative change and two large loose bodies in the joint.  A femoral neck cut was made with the saw and the femoral head was removed.  The acetabulum was exposed.  Reaming was taken medially to the inside wall and then expanded concentrically up to 53 mm.  An attempt at a 54 cup was made but the bite was suboptimal.  This was removed and reaming was carried up to 55 followed by placement of a 56 sector cup with a 10 degree liner.  Attention was then turned toward the femur.  This was reamed and broached appropriately to a size 2.  A distal cement spacer was placed followed by pulsatile lavage of the canal.  Cement was mixed with antibiotic and pressurized into the femur followed by placement of a #2 high offset luster Depuy stem.  This was held in about 15 degrees of anteversion and excess cement was trimmed while pressure was held on the component until the cement had hardened.  A +5 28 mm stainless steel ball was then applied to the dried stem followed by reduction of the hip.  This was stable in extension with external rotation as  well as flexion with internal rotation.  The leg lengths were felt to be roughly equal.  The wound was thoroughly irrigated followed by reapproximation of short external rotators with nonabsorbable sutures in the greater trochanter.  A drain was placed exiting distally.  The fascia lata and gluteus maximus were repaired with #1 Vicryl in interrupted fashion followed by subcutaneous reapproximation in several layers and the skin with staples.  Adaptic was  applied to the wound followed by dry gauze and tape.  Estimated blood loss and intraoperative fluids can be obtained from Anesthesia records.  DISPOSITION:  The patient was extubated in the operating room and taken to the recovery room in stable condition.  Plans were for her to be admitted to the orthopedic surgery service for appropriate postoperative care to include Lovenox and Coumadin for DVT prophylaxis and a course of IV antibiotics. Dictated by:   Lubertha Basque Jerl Santos, M.D. Attending Physician:  Marcene Corning DD:  01/16/02 TD:  01/16/02 Job: [id number]

## 2011-04-07 ENCOUNTER — Ambulatory Visit: Payer: Self-pay | Admitting: Internal Medicine

## 2011-07-19 ENCOUNTER — Ambulatory Visit: Payer: Self-pay | Admitting: Internal Medicine

## 2011-08-24 ENCOUNTER — Ambulatory Visit: Payer: Self-pay | Admitting: Family

## 2012-01-18 ENCOUNTER — Ambulatory Visit: Payer: Self-pay | Admitting: Internal Medicine

## 2012-01-24 ENCOUNTER — Other Ambulatory Visit: Payer: Self-pay | Admitting: Internal Medicine

## 2012-01-24 DIAGNOSIS — M549 Dorsalgia, unspecified: Secondary | ICD-10-CM

## 2012-02-08 ENCOUNTER — Ambulatory Visit
Admission: RE | Admit: 2012-02-08 | Discharge: 2012-02-08 | Disposition: A | Discharge status: Home / Self Care | Payer: Medicare Other | Source: Ambulatory Visit | Attending: Internal Medicine | Admitting: Internal Medicine

## 2012-02-08 DIAGNOSIS — M549 Dorsalgia, unspecified: Secondary | ICD-10-CM

## 2012-02-08 MED ORDER — IOHEXOL 180 MG/ML  SOLN
1.0000 mL | Freq: Once | INTRAMUSCULAR | Status: AC | PRN
  Administered 2012-02-08: 1 mL via EPIDURAL

## 2012-02-08 MED ORDER — METHYLPREDNISOLONE ACETATE 40 MG/ML INJ SUSP (RADIOLOG
120.0000 mg | Freq: Once | INTRAMUSCULAR | Status: AC
  Administered 2012-02-08: 120 mg via EPIDURAL

## 2012-02-08 NOTE — Discharge Instructions (Signed)
Post Procedure Spinal Discharge Instruction Sheet  1. You may resume a regular diet and any medications that you routinely take (including pain medications).  2. No driving day of procedure.  3. Light activity throughout the rest of the day.  Do not do any strenuous work, exercise, bending or lifting.  The day following the procedure, you can resume normal physical activity but you should refrain from exercising or physical therapy for at least three days thereafter.   Common Side Effects:   Headaches- take your usual medications as directed by your physician.  Increase your fluid intake.  Caffeinated beverages may be helpful.  Lie flat in bed until your headache resolves.   Restlessness or inability to sleep- you may have trouble sleeping for the next few days.  Ask your referring physician if you need any medication for sleep.   Facial flushing or redness- should subside within a few days.   Increased pain- a temporary increase in pain a day or two following your procedure is not unusual.  Take your pain medication as prescribed by your referring physician.   Leg cramps  Please contact our office at [phone] for the following symptoms:  Fever greater than 100 degrees.  Headaches unresolved with medication after 2-3 days.  Increased swelling, pain, or redness at injection site.  Thank you for visiting our office.   May resume coumadin today. 

## 2012-04-26 IMAGING — US ULTRASOUND RIGHT BREAST
1 series · 17 of 20 positions shown · non-contrast
Comparison: none

REASON FOR EXAM: R swelling  implant leakage   yrly
COMMENTS:

PROCEDURE:     US  - US BREAST RIGHT  - March 31, 2010  [DATE]
RESULT:     The borders of the patient's implant are difficult to
distinguish. The implant is misshapened and appears that it may be ruptured.
Surgical consultation is suggested.

[Series 1: ultrasound right breast · 17 of 20 slices shown]
[im 1/20]
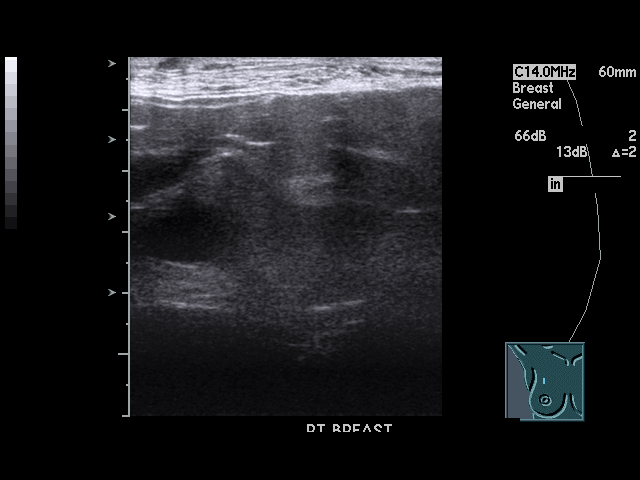
[im 2/20]
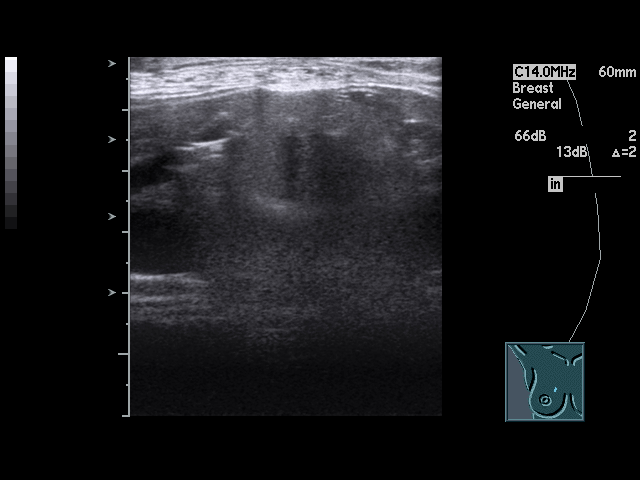
[im 3/20]
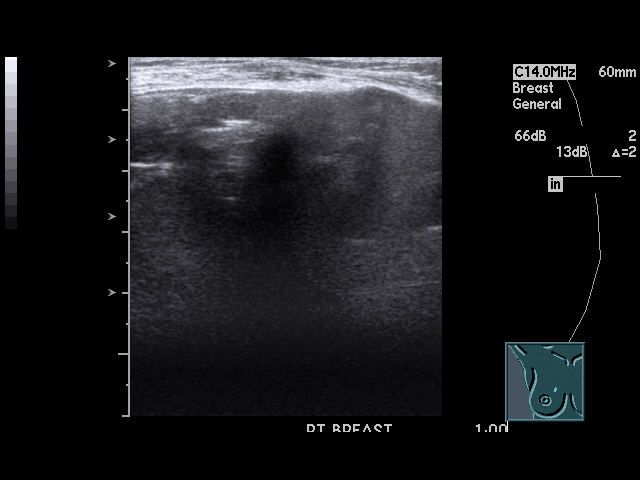
[im 5/20]
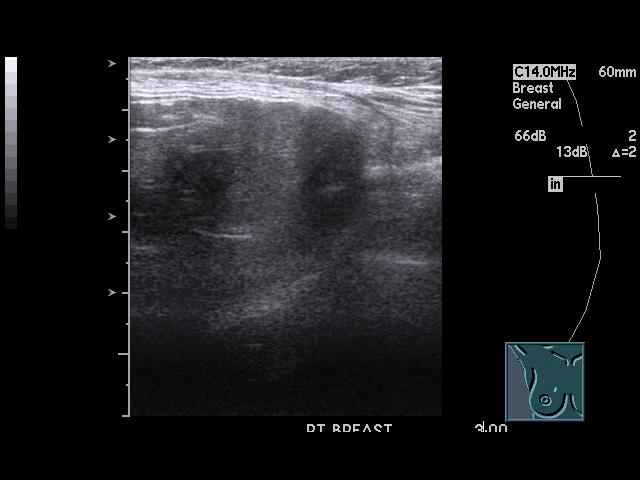
[im 6/20]
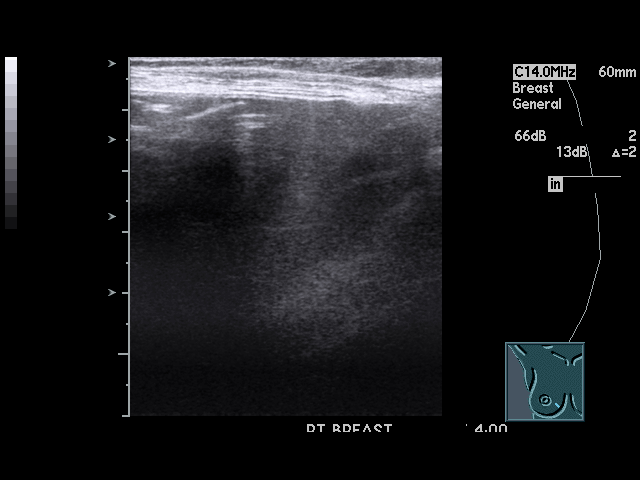
[im 7/20]
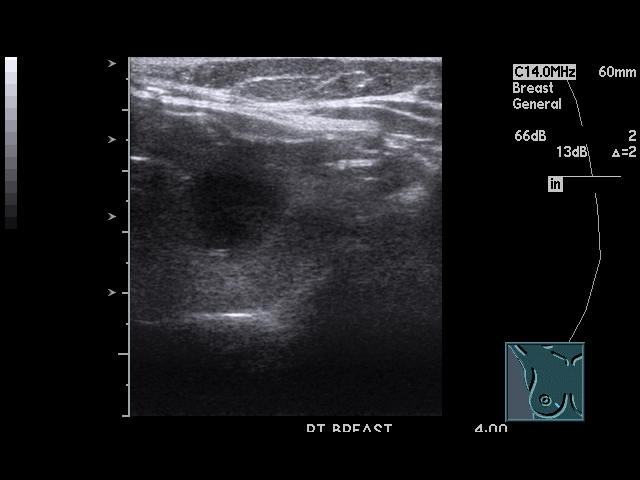
[im 8/20]
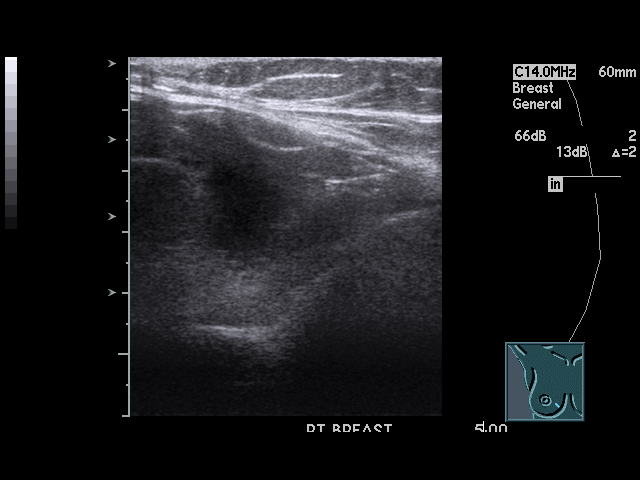
[im 9/20]
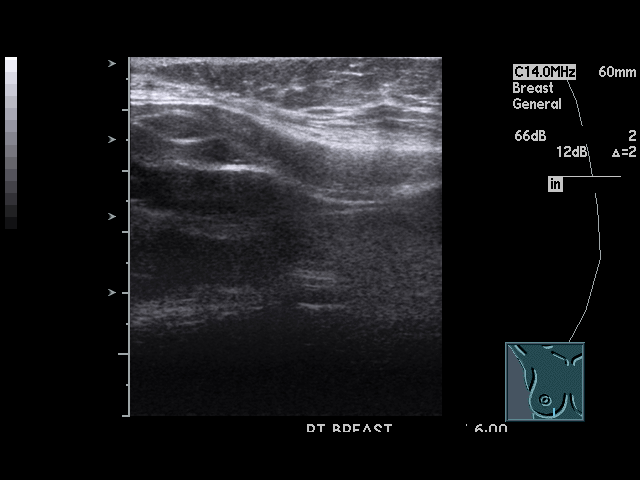
[im 11/20]
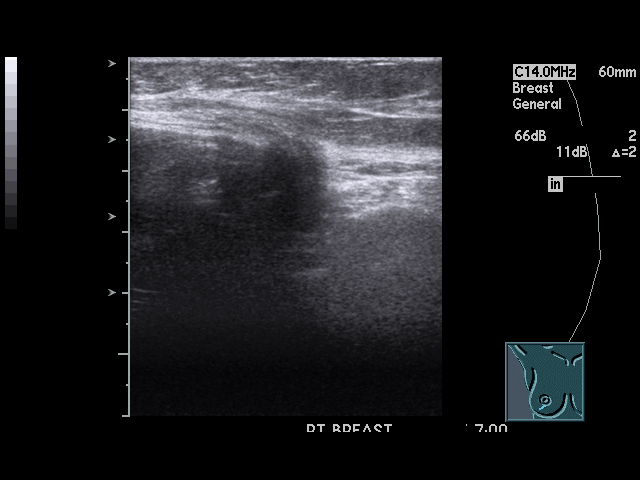
[im 12/20]
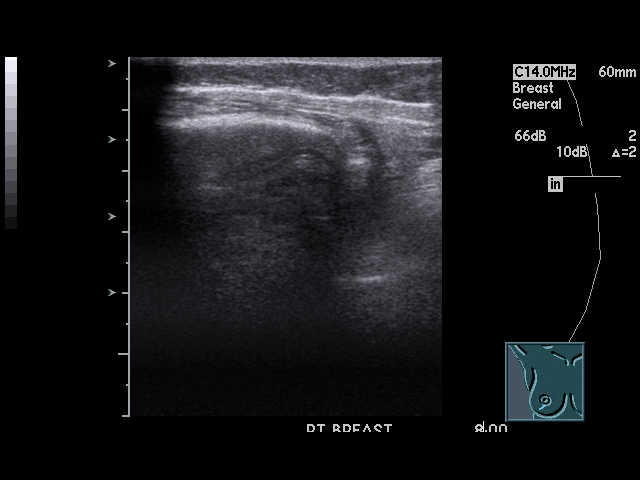
[im 13/20]
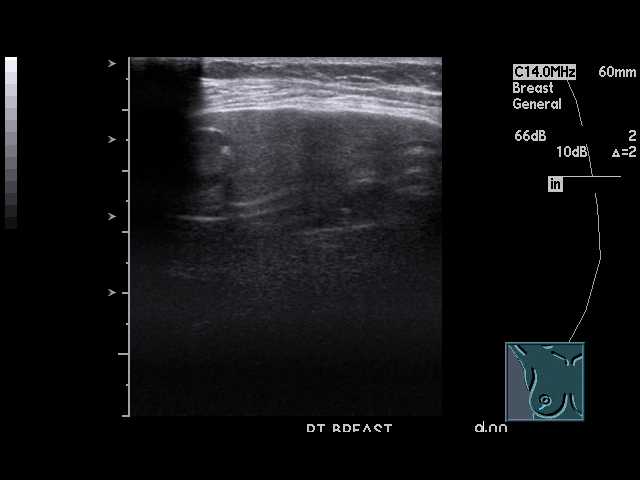
[im 14/20]
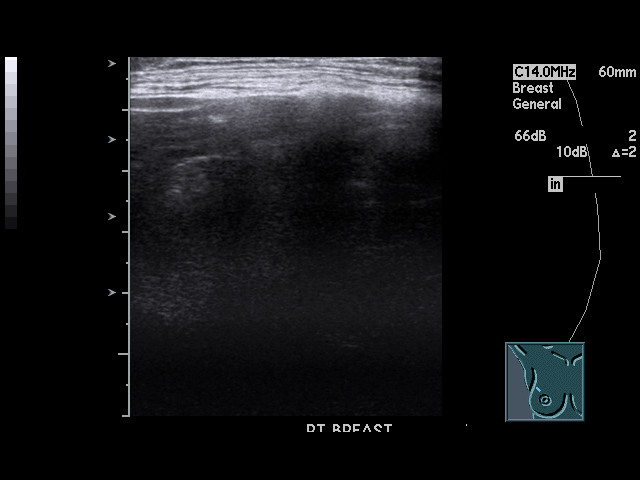
[im 15/20]
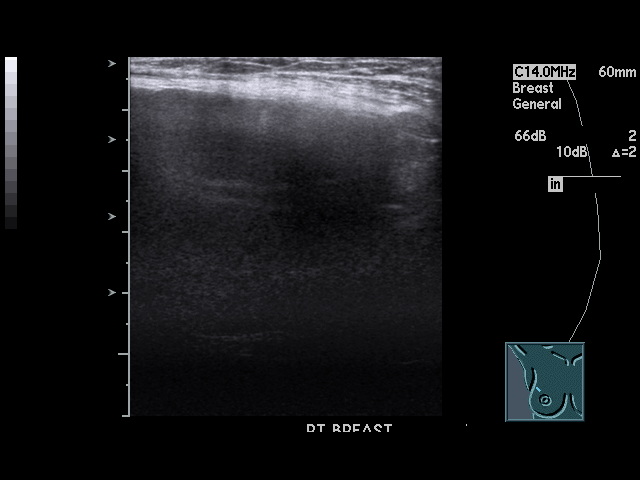
[im 16/20]
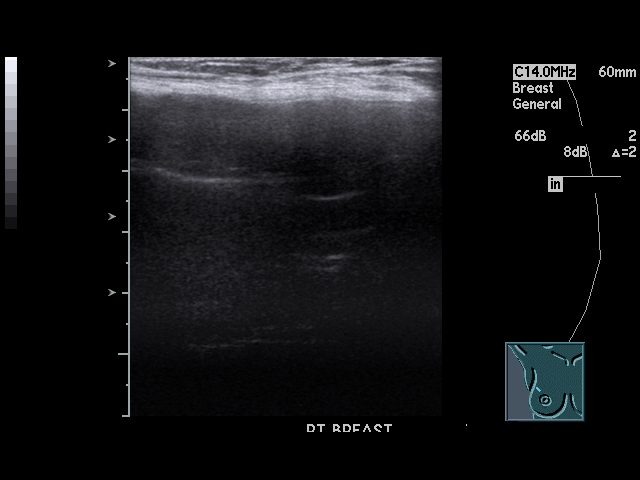
[im 18/20]
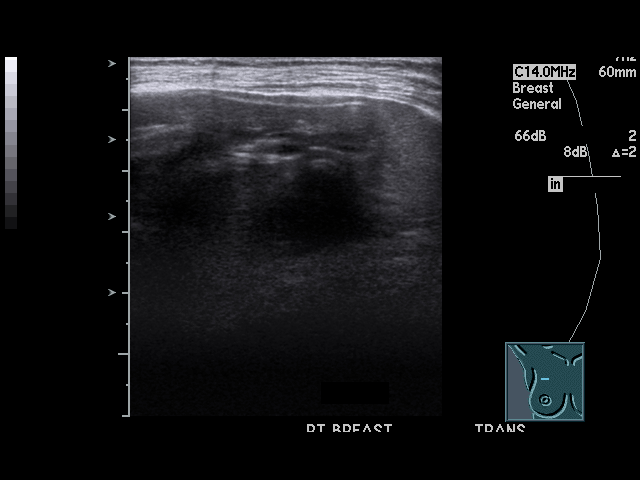
[im 19/20]
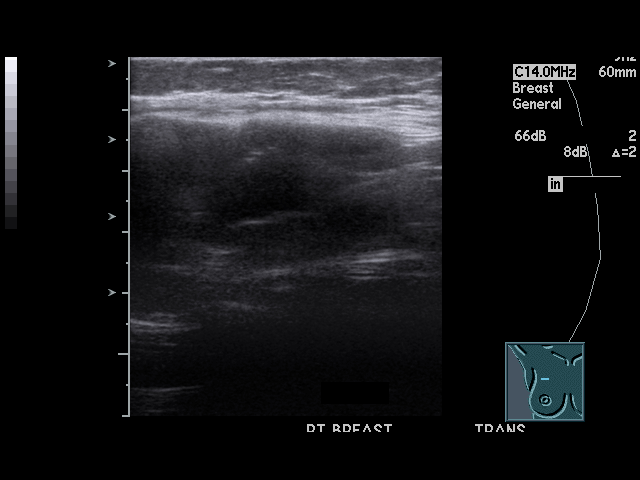
[im 20/20]
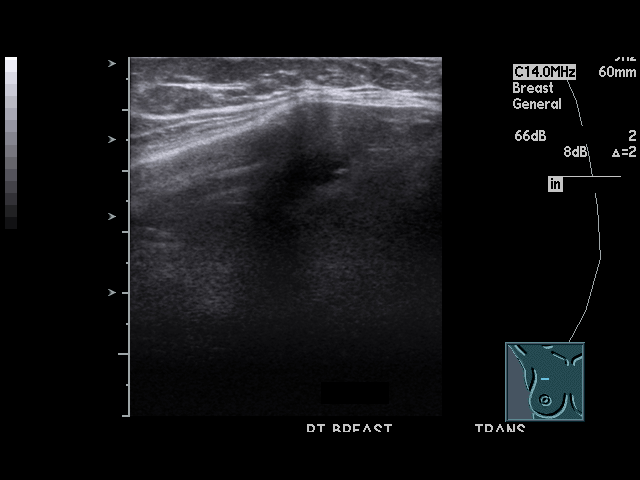

[17 of 20 positions shown; findings below may reference images not displayed]

IMPRESSION: Cannot exclude right breast implant rupture. If further
imaging evaluation is needed, MRI can be obtained. Surgical consultation
should be considered.

## 2012-05-02 ENCOUNTER — Other Ambulatory Visit: Payer: Self-pay | Admitting: Internal Medicine

## 2012-05-02 DIAGNOSIS — M549 Dorsalgia, unspecified: Secondary | ICD-10-CM

## 2012-05-10 ENCOUNTER — Other Ambulatory Visit: Payer: Medicare Other

## 2012-05-12 ENCOUNTER — Ambulatory Visit
Admission: RE | Admit: 2012-05-12 | Discharge: 2012-05-12 | Disposition: A | Discharge status: Home / Self Care | Payer: Medicare Other | Source: Ambulatory Visit | Attending: Internal Medicine | Admitting: Internal Medicine

## 2012-05-12 DIAGNOSIS — M549 Dorsalgia, unspecified: Secondary | ICD-10-CM

## 2012-05-12 MED ORDER — METHYLPREDNISOLONE ACETATE 40 MG/ML INJ SUSP (RADIOLOG
120.0000 mg | Freq: Once | INTRAMUSCULAR | Status: AC
  Administered 2012-05-12: 120 mg via EPIDURAL

## 2012-05-12 MED ORDER — IOHEXOL 180 MG/ML  SOLN
1.0000 mL | Freq: Once | INTRAMUSCULAR | Status: AC | PRN
  Administered 2012-05-12: 1 mL via EPIDURAL

## 2013-01-01 ENCOUNTER — Inpatient Hospital Stay: Payer: Self-pay | Admitting: Family Medicine

## 2013-01-01 LAB — CBC
HCT: 38.3 % (ref 35.0–47.0)
HGB: 12.7 g/dL (ref 12.0–16.0)
MCH: 32.7 pg (ref 26.0–34.0)
MCHC: 33.2 g/dL (ref 32.0–36.0)
MCV: 98 fL (ref 80–100)
Platelet: 239 10*3/uL (ref 150–440)
RBC: 3.89 10*6/uL (ref 3.80–5.20)
RDW: 15.1 % — ABNORMAL HIGH (ref 11.5–14.5)
WBC: 7 10*3/uL (ref 3.6–11.0)

## 2013-01-01 LAB — COMPREHENSIVE METABOLIC PANEL
Albumin: 3.3 g/dL — ABNORMAL LOW (ref 3.4–5.0)
Alkaline Phosphatase: 180 U/L — ABNORMAL HIGH (ref 50–136)
Anion Gap: 8 (ref 7–16)
BUN: 32 mg/dL — ABNORMAL HIGH (ref 7–18)
Bilirubin,Total: 1 mg/dL (ref 0.2–1.0)
Calcium, Total: 10.3 mg/dL — ABNORMAL HIGH (ref 8.5–10.1)
Chloride: 103 mmol/L (ref 98–107)
Co2: 25 mmol/L (ref 21–32)
Creatinine: 2.01 mg/dL — ABNORMAL HIGH (ref 0.60–1.30)
EGFR (African American): 25 — ABNORMAL LOW
EGFR (Non-African Amer.): 21 — ABNORMAL LOW
Glucose: 142 mg/dL — ABNORMAL HIGH (ref 65–99)
Osmolality: 281 (ref 275–301)
Potassium: 4.6 mmol/L (ref 3.5–5.1)
SGOT(AST): 51 U/L — ABNORMAL HIGH (ref 15–37)
SGPT (ALT): 21 U/L (ref 12–78)
Sodium: 136 mmol/L (ref 136–145)
Total Protein: 7.5 g/dL (ref 6.4–8.2)

## 2013-01-01 LAB — URINALYSIS, COMPLETE
Bilirubin,UR: NEGATIVE
Blood: NEGATIVE
Glucose,UR: NEGATIVE mg/dL (ref 0–75)
Hyaline Cast: 5
Ketone: NEGATIVE
Nitrite: NEGATIVE
Ph: 5 (ref 4.5–8.0)
Protein: NEGATIVE
RBC,UR: 1 /HPF (ref 0–5)
Specific Gravity: 1.011 (ref 1.003–1.030)
Squamous Epithelial: NONE SEEN
WBC UR: 29 /HPF (ref 0–5)

## 2013-01-01 LAB — TROPONIN I
Troponin-I: <0.02 ng/mL
Troponin-I: <0.02 ng/mL

## 2013-01-01 LAB — APTT: Activated PTT: 38.5 secs — ABNORMAL HIGH (ref 23.6–35.9)

## 2013-01-01 LAB — CK TOTAL AND CKMB (NOT AT ARMC)
CK, Total: 72 U/L (ref 21–215)
CK-MB: 0.6 ng/mL (ref 0.5–3.6)

## 2013-01-01 LAB — TSH: Thyroid Stimulating Horm: 4.17 u[IU]/mL

## 2013-01-01 LAB — PROTIME-INR
INR: 1.5
Prothrombin Time: 17.8 secs — ABNORMAL HIGH (ref 11.5–14.7)

## 2013-01-01 LAB — MAGNESIUM: Magnesium: 1.5 mg/dL — ABNORMAL LOW

## 2013-01-02 LAB — BASIC METABOLIC PANEL
Anion Gap: 9 (ref 7–16)
BUN: 36 mg/dL — ABNORMAL HIGH (ref 7–18)
Calcium, Total: 9.7 mg/dL (ref 8.5–10.1)
Chloride: 104 mmol/L (ref 98–107)
Creatinine: 1.61 mg/dL — ABNORMAL HIGH (ref 0.60–1.30)
EGFR (Non-African Amer.): 28 — ABNORMAL LOW
Glucose: 117 mg/dL — ABNORMAL HIGH (ref 65–99)
Osmolality: 283 (ref 275–301)

## 2013-01-02 LAB — MAGNESIUM: Magnesium: 2.4 mg/dL

## 2013-01-02 LAB — TSH: Thyroid Stimulating Horm: 1.72 u[IU]/mL

## 2013-01-03 LAB — PROTIME-INR: INR: 1.6

## 2013-01-04 LAB — URINE CULTURE

## 2013-01-04 LAB — BASIC METABOLIC PANEL
Anion Gap: 9 (ref 7–16)
BUN: 26 mg/dL — ABNORMAL HIGH (ref 7–18)
Calcium, Total: 9.6 mg/dL (ref 8.5–10.1)
Chloride: 107 mmol/L (ref 98–107)
Creatinine: 1.45 mg/dL — ABNORMAL HIGH (ref 0.60–1.30)
EGFR (African American): 36 — ABNORMAL LOW
EGFR (Non-African Amer.): 31 — ABNORMAL LOW
Osmolality: 279 (ref 275–301)
Potassium: 4 mmol/L (ref 3.5–5.1)

## 2013-01-04 LAB — PROTIME-INR
INR: 1.6
Prothrombin Time: 18.5 secs — ABNORMAL HIGH (ref 11.5–14.7)

## 2013-01-05 ENCOUNTER — Encounter: Payer: Self-pay | Admitting: Internal Medicine

## 2013-01-05 LAB — PROTIME-INR
INR: 1.7
Prothrombin Time: 19.7 secs — ABNORMAL HIGH (ref 11.5–14.7)

## 2013-01-05 LAB — HEMOGLOBIN: HGB: 10.4 g/dL — ABNORMAL LOW (ref 12.0–16.0)

## 2013-01-05 LAB — PLATELET COUNT: Platelet: 196 10*3/uL (ref 150–440)

## 2013-01-05 LAB — MAGNESIUM: Magnesium: 1.4 mg/dL — ABNORMAL LOW

## 2013-01-06 LAB — MAGNESIUM: Magnesium: 1.6 mg/dL — ABNORMAL LOW

## 2013-01-06 LAB — BASIC METABOLIC PANEL
BUN: 34 mg/dL — ABNORMAL HIGH (ref 7–18)
Calcium, Total: 9.4 mg/dL (ref 8.5–10.1)
Creatinine: 1.6 mg/dL — ABNORMAL HIGH (ref 0.60–1.30)
EGFR (African American): 32 — ABNORMAL LOW
Glucose: 97 mg/dL (ref 65–99)
Osmolality: 281 (ref 275–301)
Potassium: 5.1 mmol/L (ref 3.5–5.1)
Sodium: 137 mmol/L (ref 136–145)

## 2013-01-07 LAB — BASIC METABOLIC PANEL
Anion Gap: 8 (ref 7–16)
BUN: 41 mg/dL — ABNORMAL HIGH (ref 7–18)
Calcium, Total: 9.3 mg/dL (ref 8.5–10.1)
Chloride: 106 mmol/L (ref 98–107)
Co2: 20 mmol/L — ABNORMAL LOW (ref 21–32)
Creatinine: 1.77 mg/dL — ABNORMAL HIGH (ref 0.60–1.30)
EGFR (Non-African Amer.): 25 — ABNORMAL LOW
Glucose: 123 mg/dL — ABNORMAL HIGH (ref 65–99)
Osmolality: 280 (ref 275–301)
Potassium: 5.2 mmol/L — ABNORMAL HIGH (ref 3.5–5.1)

## 2013-01-07 LAB — CBC WITH DIFFERENTIAL/PLATELET
Basophil #: 0.1 10*3/uL (ref 0.0–0.1)
Basophil %: 0.9 %
Eosinophil %: 1.4 %
HCT: 31.7 % — ABNORMAL LOW (ref 35.0–47.0)
HGB: 10.6 g/dL — ABNORMAL LOW (ref 12.0–16.0)
Lymphocyte %: 14.2 %
MCH: 33.1 pg (ref 26.0–34.0)
MCV: 99 fL (ref 80–100)
Neutrophil #: 4.4 10*3/uL (ref 1.4–6.5)
Platelet: 230 10*3/uL (ref 150–440)
RBC: 3.21 10*6/uL — ABNORMAL LOW (ref 3.80–5.20)

## 2013-01-07 LAB — PROTIME-INR
INR: 2.4
Prothrombin Time: 25.3 secs — ABNORMAL HIGH (ref 11.5–14.7)

## 2013-01-08 ENCOUNTER — Inpatient Hospital Stay: Payer: Self-pay | Admitting: Internal Medicine

## 2013-01-08 LAB — CK TOTAL AND CKMB (NOT AT ARMC)
CK, Total: 39 U/L (ref 21–215)
CK, Total: 54 U/L (ref 21–215)
CK-MB: <0.5 ng/mL — ABNORMAL LOW (ref 0.5–3.6)
CK-MB: <0.5 ng/mL — ABNORMAL LOW (ref 0.5–3.6)

## 2013-01-08 LAB — COMPREHENSIVE METABOLIC PANEL
Albumin: 2.4 g/dL — ABNORMAL LOW (ref 3.4–5.0)
Alkaline Phosphatase: 210 U/L — ABNORMAL HIGH (ref 50–136)
Bilirubin,Total: 0.4 mg/dL (ref 0.2–1.0)
Calcium, Total: 8.5 mg/dL (ref 8.5–10.1)
Co2: 20 mmol/L — ABNORMAL LOW (ref 21–32)
Creatinine: 1.94 mg/dL — ABNORMAL HIGH (ref 0.60–1.30)
Osmolality: 285 (ref 275–301)
SGPT (ALT): 56 U/L (ref 12–78)

## 2013-01-08 LAB — MAGNESIUM: Magnesium: 1.4 mg/dL — ABNORMAL LOW

## 2013-01-08 LAB — CBC
HCT: 28.4 % — ABNORMAL LOW (ref 35.0–47.0)
HGB: 9.1 g/dL — ABNORMAL LOW (ref 12.0–16.0)
Platelet: 207 10*3/uL (ref 150–440)
RBC: 2.81 10*6/uL — ABNORMAL LOW (ref 3.80–5.20)

## 2013-01-08 LAB — TROPONIN I
Troponin-I: <0.02 ng/mL
Troponin-I: <0.02 ng/mL

## 2013-01-09 LAB — CBC WITH DIFFERENTIAL/PLATELET
Basophil %: 0.4 %
Eosinophil #: 0 10*3/uL (ref 0.0–0.7)
Eosinophil %: 0.6 %
HGB: 9.8 g/dL — ABNORMAL LOW (ref 12.0–16.0)
Lymphocyte #: 0.9 10*3/uL — ABNORMAL LOW (ref 1.0–3.6)
Lymphocyte %: 13.3 %
MCH: 32.9 pg (ref 26.0–34.0)
MCHC: 33.4 g/dL (ref 32.0–36.0)
Neutrophil %: 70.6 %
WBC: 6.8 10*3/uL (ref 3.6–11.0)

## 2013-01-09 LAB — TROPONIN I: Troponin-I: <0.02 ng/mL

## 2013-01-09 LAB — BASIC METABOLIC PANEL
Anion Gap: 9 (ref 7–16)
Calcium, Total: 9.3 mg/dL (ref 8.5–10.1)
Chloride: 105 mmol/L (ref 98–107)
Co2: 22 mmol/L (ref 21–32)
Creatinine: 2.09 mg/dL — ABNORMAL HIGH (ref 0.60–1.30)
EGFR (African American): 23 — ABNORMAL LOW
Glucose: 112 mg/dL — ABNORMAL HIGH (ref 65–99)
Osmolality: 284 (ref 275–301)
Potassium: 4.3 mmol/L (ref 3.5–5.1)
Sodium: 136 mmol/L (ref 136–145)

## 2013-01-09 LAB — PROTIME-INR: INR: 2.8

## 2013-01-09 LAB — TSH: Thyroid Stimulating Horm: 1.94 u[IU]/mL

## 2013-01-09 LAB — CK TOTAL AND CKMB (NOT AT ARMC): CK, Total: 67 U/L (ref 21–215)

## 2013-01-10 LAB — BASIC METABOLIC PANEL
Anion Gap: 8 (ref 7–16)
BUN: 43 mg/dL — ABNORMAL HIGH (ref 7–18)
Calcium, Total: 9.6 mg/dL (ref 8.5–10.1)
Chloride: 105 mmol/L (ref 98–107)
Co2: 22 mmol/L (ref 21–32)
Creatinine: 1.6 mg/dL — ABNORMAL HIGH (ref 0.60–1.30)
EGFR (African American): 32 — ABNORMAL LOW
EGFR (Non-African Amer.): 28 — ABNORMAL LOW
Glucose: 107 mg/dL — ABNORMAL HIGH (ref 65–99)
Osmolality: 281 (ref 275–301)
Potassium: 4.2 mmol/L (ref 3.5–5.1)
Sodium: 135 mmol/L — ABNORMAL LOW (ref 136–145)

## 2013-01-10 LAB — MAGNESIUM: Magnesium: 1.9 mg/dL

## 2013-01-11 LAB — PROTIME-INR
INR: 1.8
Prothrombin Time: 20.9 secs — ABNORMAL HIGH (ref 11.5–14.7)

## 2013-01-11 LAB — BASIC METABOLIC PANEL
Anion Gap: 8 (ref 7–16)
BUN: 33 mg/dL — ABNORMAL HIGH (ref 7–18)
Calcium, Total: 9.7 mg/dL (ref 8.5–10.1)
Chloride: 105 mmol/L (ref 98–107)
Co2: 23 mmol/L (ref 21–32)
Creatinine: 1.29 mg/dL (ref 0.60–1.30)
EGFR (African American): 42 — ABNORMAL LOW
EGFR (Non-African Amer.): 36 — ABNORMAL LOW
Glucose: 103 mg/dL — ABNORMAL HIGH (ref 65–99)
Osmolality: 279 (ref 275–301)
Potassium: 3.9 mmol/L (ref 3.5–5.1)
Sodium: 136 mmol/L (ref 136–145)

## 2013-01-12 LAB — CBC WITH DIFFERENTIAL/PLATELET
Eosinophil #: 0.1 10*3/uL (ref 0.0–0.7)
HCT: 30.4 % — ABNORMAL LOW (ref 35.0–47.0)
Lymphocyte #: 0.9 10*3/uL — ABNORMAL LOW (ref 1.0–3.6)
Lymphocyte %: 17 %
MCH: 33.2 pg (ref 26.0–34.0)
MCV: 98 fL (ref 80–100)
Monocyte #: 0.7 x10 3/mm (ref 0.2–0.9)
Monocyte %: 12.6 %
Neutrophil #: 3.5 10*3/uL (ref 1.4–6.5)
Neutrophil %: 67 %
Platelet: 220 10*3/uL (ref 150–440)
RBC: 3.1 10*6/uL — ABNORMAL LOW (ref 3.80–5.20)
RDW: 15.8 % — ABNORMAL HIGH (ref 11.5–14.5)
WBC: 5.3 10*3/uL (ref 3.6–11.0)

## 2013-01-12 LAB — PROTIME-INR
INR: 1.3
Prothrombin Time: 16.2 secs — ABNORMAL HIGH (ref 11.5–14.7)

## 2013-01-12 LAB — BASIC METABOLIC PANEL
Chloride: 107 mmol/L (ref 98–107)
Creatinine: 1.05 mg/dL (ref 0.60–1.30)
EGFR (African American): 54 — ABNORMAL LOW
Glucose: 102 mg/dL — ABNORMAL HIGH (ref 65–99)
Osmolality: 281 (ref 275–301)
Potassium: 4.2 mmol/L (ref 3.5–5.1)
Sodium: 138 mmol/L (ref 136–145)

## 2013-01-14 LAB — PROTIME-INR
INR: 1.1
Prothrombin Time: 14.8 secs — ABNORMAL HIGH (ref 11.5–14.7)

## 2013-01-15 ENCOUNTER — Encounter: Payer: Self-pay | Admitting: Internal Medicine

## 2013-01-16 LAB — PROTIME-INR
INR: 1.2
Prothrombin Time: 15.5 secs — ABNORMAL HIGH (ref 11.5–14.7)

## 2013-01-24 LAB — PROTIME-INR: Prothrombin Time: 31.4 secs — ABNORMAL HIGH (ref 11.5–14.7)

## 2013-01-30 LAB — PROTIME-INR
INR: 4.6
Prothrombin Time: 41.2 secs — ABNORMAL HIGH (ref 11.5–14.7)

## 2013-03-05 ENCOUNTER — Ambulatory Visit: Payer: Self-pay | Admitting: Internal Medicine

## 2013-06-16 ENCOUNTER — Ambulatory Visit: Payer: Self-pay | Admitting: Hematology and Oncology

## 2013-06-22 ENCOUNTER — Ambulatory Visit: Payer: Self-pay | Admitting: Internal Medicine

## 2013-06-27 LAB — COMPREHENSIVE METABOLIC PANEL
Albumin: 3.1 g/dL — ABNORMAL LOW (ref 3.4–5.0)
Alkaline Phosphatase: 323 U/L — ABNORMAL HIGH (ref 50–136)
Anion Gap: 5 — ABNORMAL LOW (ref 7–16)
BUN: 41 mg/dL — ABNORMAL HIGH (ref 7–18)
Bilirubin,Total: 1 mg/dL (ref 0.2–1.0)
Calcium, Total: 10.3 mg/dL — ABNORMAL HIGH (ref 8.5–10.1)
Chloride: 103 mmol/L (ref 98–107)
Co2: 27 mmol/L (ref 21–32)
Creatinine: 1.6 mg/dL — ABNORMAL HIGH (ref 0.60–1.30)
EGFR (African American): 32 — ABNORMAL LOW
EGFR (Non-African Amer.): 28 — ABNORMAL LOW
Glucose: 118 mg/dL — ABNORMAL HIGH (ref 65–99)
Osmolality: 281 (ref 275–301)
Potassium: 4.6 mmol/L (ref 3.5–5.1)
SGOT(AST): 139 U/L — ABNORMAL HIGH (ref 15–37)
SGPT (ALT): 27 U/L (ref 12–78)
Sodium: 135 mmol/L — ABNORMAL LOW (ref 136–145)
Total Protein: 7.6 g/dL (ref 6.4–8.2)

## 2013-06-27 LAB — BASIC METABOLIC PANEL
Anion Gap: 6 — ABNORMAL LOW (ref 7–16)
BUN: 40 mg/dL — ABNORMAL HIGH (ref 7–18)
Calcium, Total: 9.8 mg/dL (ref 8.5–10.1)
Chloride: 104 mmol/L (ref 98–107)
Co2: 26 mmol/L (ref 21–32)
Creatinine: 1.47 mg/dL — ABNORMAL HIGH (ref 0.60–1.30)
EGFR (African American): 36 — ABNORMAL LOW
EGFR (Non-African Amer.): 31 — ABNORMAL LOW
Glucose: 127 mg/dL — ABNORMAL HIGH (ref 65–99)
Osmolality: 283 (ref 275–301)
Potassium: 4.5 mmol/L (ref 3.5–5.1)
Sodium: 136 mmol/L (ref 136–145)

## 2013-06-27 LAB — CBC
HCT: 32.7 % — ABNORMAL LOW (ref 35.0–47.0)
HGB: 10.7 g/dL — ABNORMAL LOW (ref 12.0–16.0)
MCH: 33.1 pg (ref 26.0–34.0)
MCHC: 32.7 g/dL (ref 32.0–36.0)
MCV: 101 fL — ABNORMAL HIGH (ref 80–100)
Platelet: 207 10*3/uL (ref 150–440)
RBC: 3.22 10*6/uL — ABNORMAL LOW (ref 3.80–5.20)
RDW: 17.2 % — ABNORMAL HIGH (ref 11.5–14.5)
WBC: 6.7 10*3/uL (ref 3.6–11.0)

## 2013-06-27 LAB — PROTIME-INR
INR: 2.1
Prothrombin Time: 22.8 secs — ABNORMAL HIGH (ref 11.5–14.7)

## 2013-06-28 ENCOUNTER — Inpatient Hospital Stay: Payer: Self-pay | Admitting: Internal Medicine

## 2013-06-28 LAB — CBC WITH DIFFERENTIAL/PLATELET
Basophil #: 0 10*3/uL (ref 0.0–0.1)
Basophil %: 0.5 %
Eosinophil #: 0.1 10*3/uL (ref 0.0–0.7)
Eosinophil %: 1 %
HCT: 29.8 % — ABNORMAL LOW (ref 35.0–47.0)
HGB: 9.8 g/dL — ABNORMAL LOW (ref 12.0–16.0)
Lymphocyte #: 0.7 10*3/uL — ABNORMAL LOW (ref 1.0–3.6)
Lymphocyte %: 10.7 %
MCH: 33.1 pg (ref 26.0–34.0)
MCHC: 32.9 g/dL (ref 32.0–36.0)
MCV: 101 fL — ABNORMAL HIGH (ref 80–100)
Monocyte #: 1.1 x10 3/mm — ABNORMAL HIGH (ref 0.2–0.9)
Monocyte %: 18 %
Neutrophil #: 4.3 10*3/uL (ref 1.4–6.5)
Neutrophil %: 69.8 %
Platelet: 189 10*3/uL (ref 150–440)
RBC: 2.96 10*6/uL — ABNORMAL LOW (ref 3.80–5.20)
RDW: 17.2 % — ABNORMAL HIGH (ref 11.5–14.5)
WBC: 6.1 10*3/uL (ref 3.6–11.0)

## 2013-06-28 LAB — COMPREHENSIVE METABOLIC PANEL
Albumin: 2.5 g/dL — ABNORMAL LOW (ref 3.4–5.0)
Alkaline Phosphatase: 278 U/L — ABNORMAL HIGH (ref 50–136)
Anion Gap: 6 — ABNORMAL LOW (ref 7–16)
BUN: 33 mg/dL — ABNORMAL HIGH (ref 7–18)
Bilirubin,Total: 0.7 mg/dL (ref 0.2–1.0)
Calcium, Total: 9.4 mg/dL (ref 8.5–10.1)
Chloride: 105 mmol/L (ref 98–107)
Co2: 26 mmol/L (ref 21–32)
Creatinine: 1.46 mg/dL — ABNORMAL HIGH (ref 0.60–1.30)
EGFR (African American): 36 — ABNORMAL LOW
EGFR (Non-African Amer.): 31 — ABNORMAL LOW
Glucose: 121 mg/dL — ABNORMAL HIGH (ref 65–99)
Osmolality: 282 (ref 275–301)
Potassium: 4.9 mmol/L (ref 3.5–5.1)
SGOT(AST): 119 U/L — ABNORMAL HIGH (ref 15–37)
SGPT (ALT): 21 U/L (ref 12–78)
Sodium: 137 mmol/L (ref 136–145)
Total Protein: 6.3 g/dL — ABNORMAL LOW (ref 6.4–8.2)

## 2013-06-29 LAB — CBC WITH DIFFERENTIAL/PLATELET
Basophil #: 0 10*3/uL (ref 0.0–0.1)
Basophil %: 0.6 %
Eosinophil #: 0 10*3/uL (ref 0.0–0.7)
Eosinophil %: 0.4 %
HCT: 27.3 % — ABNORMAL LOW (ref 35.0–47.0)
HGB: 9.1 g/dL — ABNORMAL LOW (ref 12.0–16.0)
Lymphocyte #: 0.7 10*3/uL — ABNORMAL LOW (ref 1.0–3.6)
Lymphocyte %: 12.8 %
MCH: 33.3 pg (ref 26.0–34.0)
MCHC: 33.3 g/dL (ref 32.0–36.0)
MCV: 100 fL (ref 80–100)
Monocyte #: 0.9 x10 3/mm (ref 0.2–0.9)
Monocyte %: 16.9 %
Neutrophil #: 3.8 10*3/uL (ref 1.4–6.5)
Neutrophil %: 69.3 %
Platelet: 176 10*3/uL (ref 150–440)
RBC: 2.73 10*6/uL — ABNORMAL LOW (ref 3.80–5.20)
RDW: 17.3 % — ABNORMAL HIGH (ref 11.5–14.5)
WBC: 5.5 10*3/uL (ref 3.6–11.0)

## 2013-06-29 LAB — BASIC METABOLIC PANEL
Anion Gap: 6 — ABNORMAL LOW (ref 7–16)
BUN: 34 mg/dL — ABNORMAL HIGH (ref 7–18)
Calcium, Total: 9.5 mg/dL (ref 8.5–10.1)
Chloride: 107 mmol/L (ref 98–107)
Co2: 25 mmol/L (ref 21–32)
Creatinine: 1.18 mg/dL (ref 0.60–1.30)
EGFR (African American): 47 — ABNORMAL LOW
EGFR (Non-African Amer.): 40 — ABNORMAL LOW
Glucose: 126 mg/dL — ABNORMAL HIGH (ref 65–99)
Osmolality: 285 (ref 275–301)
Potassium: 4.4 mmol/L (ref 3.5–5.1)
Sodium: 138 mmol/L (ref 136–145)

## 2013-06-29 LAB — PROTIME-INR
INR: 2
Prothrombin Time: 22 secs — ABNORMAL HIGH (ref 11.5–14.7)

## 2013-09-16 ENCOUNTER — Ambulatory Visit: Payer: Self-pay | Admitting: Hematology and Oncology

## 2013-09-16 DEATH — deceased

## 2014-12-06 NOTE — Discharge Summary (Signed)
PATIENT NAME:  [name], [name] MR#:  [id number] DATE OF BIRTH:  1919-12-24  DATE OF ADMISSION:  01/05/2013 DATE OF DISCHARGE:    ADDENDUM to discharge summary.  Changes in medications include stopping the metoprolol. No more metoprolol. The reason of this is because the patient was started on first diltiazem, increase to 180 mg over 24 hours and that was not controlling her heart rate, for what Cardiology, Dr. Corky DownsJaved Masoud, recommended to add on 25 mg twice daily of metoprolol. The patient tolerated the first dose but after the second one, her heart rate started dropping down in the 40s. The patient was very symptomatic, dizzy, lightheaded and nauseous. The patient had to be taken off that medication, and today, she is on her normal self. Her heart rate overnight to stay on the 80s, the lowest at 2000 was 66, and this morning it is 82. Her blood pressure is stable as well as with 106/66 and her temperature is 97.3. Yesterday as well, we had magnesium level checked and this morning it is 1.6 for what we replace it IV. Her kidney function has been between 1.4 to 1.6 creatinine with a GFR around 28 to 31, which is stable for her. She had a set of cardiac enzymes on the 20th that were negative. No reason to repeat at this moment. The patient is totally asymptomatic. Her last INR was done yesterday, here and it is scheduled to be rechecked tomorrow. As urinalysis shows the patient has some E. coli, which is sensitive to ampicillin and cephalosporins and ciprofloxacin. She is also getting treated for a pneumonia with Augmentin. She was started on ceftriaxone and azithromycin and switch to Augmentin to continue 8 more days. At this moment, I have discussed the case with the power of attorney of the patient. She is ready to go back to the facilitate while her family arrives here within the next week or two. The patient is discharged in good condition. Please notice that Dr. Scherrie MerrittsJaved Massoud is going to follow up with  her in 1 to 2 weeks and he is planning on ordering a Holter monitoring. Appointment from the facility needs to be arranged.   TIME SPENT:  I spent about 40 minutes on this discharge today.   ____________________________ Felipa Furnaceoberto Sanchez Gutierrez, MD rsg:jm D: 01/06/2013 10:23:31 ET T: 01/06/2013 10:48:09 ET JOB#: [id number]  cc: Felipa Furnaceoberto Sanchez Gutierrez, MD, <Dictator> Corky DownsJaved Masoud, MD Pearletha FurlOBERTO SANCHEZ GUTIERRE MD ELECTRONICALLY SIGNED 01/22/2013 1:42

## 2014-12-06 NOTE — Consult Note (Signed)
PATIENT NAME:  Marisa SprinklesLOUFFE, Trena M MR#:  [id number] DATE OF BIRTH:  22-Apr-1920  DATE OF CONSULTATION:  01/08/2013  CONSULTING PHYSICIAN:  Corky DownsJaved Shawntae Lowy, MD  [facility]. She was found to have a junctional bradycardia in the nursing home with a heart rate of 26 and low blood pressure. The patient has been taking Cardizem 180 mg p.o. daily for tachyarrhythmia syndrome. Earlier, she also complained of abdominal pain and chest pain. There is no history of nausea, vomiting, fever or chills. The patient has recently been discharged from the hospital after treatment of atrial fibrillation.   PAST MEDICAL HISTORY: Includes history of pulmonary hypertension, coronary artery disease with stent, status post right mastectomy, history of pulmonary embolism in the remote past, atrial fibrillation with varying ventricular response. Right now the patient is bradycardic with heart rate dropping to 40.   LABORATORY AND RADIOLOGICAL DATA: Blood sugar 158, creatinine 1.94, potassium 5.9, GFR 26, magnesium 1.4, alkaline phosphatase 210. CK-MB less than 0.5. Hematocrit 28.4. Chest x-ray shows changes suggestive of congestive heart failure, right mastectomy.   IMPRESSION: Atrial fibrillation with slow ventricular response. Chronic renal failure. History of hypertension, hypotensive right now. Altered mental status, partly due to effect of fentanyl, atropine. Pulmonary hypertension. Status post bilateral hip surgeries. History of breast cancer with right mastectomy. Biventricular congestive heart failure.   RECOMMENDATIONS: The patient will be admitted to Intensive Care Unit. She is no CPR. The heart rate right now is 50 to 55 with atrial fibrillation. Because of the patient's altered mental status and COPD with hypoxia, we will hold off on  pacemaker. Because of her no code/no CPR status, we will hold off on any attempt to intubation or pulmonary ventilation. The patient will be started on  dopamine drip for renal protection. We will hold beta blockers and calcium channel blockers. Follow up on the chest x-ray. The patient's condition was discussed with her husband, Dr. Judithann SheenSparks and nurses.   ____________________________ Corky DownsJaved Mckenleigh Tarlton, MD jm:jm D: 01/08/2013 16:51:41 ET T: 01/08/2013 17:05:46 ET JOB#: [id number]  cc: Corky DownsJaved Tyesha Joffe, MD, <Dictator> Corky DownsJAVED Helios Kohlmann MD ELECTRONICALLY SIGNED 01/29/2013 13:09

## 2014-12-06 NOTE — H&P (Signed)
PATIENT NAME:  [name], HAUGEN MR#:  [id number] DATE OF BIRTH:  October 30, 1919  DATE OF ADMISSION:  01/08/2013  REFERRING PHYSICIAN: Dr. Thomasene Lot   FAMILY PHYSICIAN: Dr. Lavera Guise   REASON FOR ADMISSION: Bradycardia with hypotension and congestive heart failure.   HISTORY OF PRESENT ILLNESS: The patient is a 81 year old female with a significant history of coronary artery disease, status post myocardial infarction, atrial fibrillation on anticoagulation, congestive heart failure and pulmonary hypertension. She has had previous stent placement. She was in the hospital up until three days ago with rapid atrial fibrillation. She was sent to a skilled nursing facility on low-dose metoprolol and diltiazem. She re-presents to the Emergency Room today with weakness and bradycardia. In the Emergency Room, the patient was noted to be hypotensive and hypoxic in congestive heart failure. Her heart rate was in the 30s. She is now admitted for further evaluation.   PAST MEDICAL HISTORY: 1.  ASCVD, status post PTC with stent placement.  2.  Status post myocardial infarction.  3.  Pulmonary hypertension.  4.  History of systolic congestive heart failure.  5.  Breast cancer, status post right mastectomy.  6.  History of pulmonary embolism on anticoagulation.  7.  Atrial fibrillation with recent admission.  8.  Status post bilateral hip surgeries.  9.  Chronic obstructive pulmonary disease.   MEDICATIONS: 1.  Lopressor 25 mg p.o. daily.   2.  Zocor 20 mg p.o. daily.  3.  Celexa 40 mg p.o. daily.  4.  Klor-Con 20 mEq p.o. daily.  5.  Latanoprost eye drops as directed.  6.  Brimonidine eye drops as directed.  7.  Cardizem CD 180 mg p.o. daily.  8.  Protonix 40 mg p.o. daily.  9.  Coumadin 4 mg p.o. q. p.m.  10. Augmentin 875 mg p.o. b.i.d. x 7 days.   ALLERGIES: Spiriva.   SOCIAL HISTORY: Negative for alcohol or tobacco abuse.   FAMILY HISTORY: Positive for breast cancer and coronary artery disease.    REVIEW OF SYSTEMS: Unable to obtain from patient as she is marginally coherent.   PHYSICAL EXAMINATION: GENERAL: The patient is extremely lethargic, almost to the point of being obtunded.  VITAL SIGNS: Vital signs remarkable for a blood pressure of 80/30 with a heart rate of 39 and a respiratory rate of 22. She is afebrile.  HEENT: Normocephalic, atraumatic. Pupils equally round and reactive to light and accommodation. Extraocular movements are intact. Sclerae are anicteric. Conjunctivae are clear. Oropharynx is dry but clear.   NECK: Supple without jugular venous distention. No adenopathy or thyromegaly is noted.  LUNGS: Reveal rales approximately one third of the way up bilaterally. No wheezes or rhonchi. No dullness. Respiratory effort is increased.  CARDIAC: Slow rate with a regular rhythm. Normal S1 and S2. No significant rubs or gallops.  ABDOMEN: Soft, nontender with normoactive bowel sounds. No organomegaly or masses were appreciated. No hernias or bruits were noted.  EXTREMITIES: Without clubbing, cyanosis or edema. Pulses are 1+ bilaterally.  SKIN: Warm and dry without rash or lesions.   NEUROLOGIC: Cranial nerves II through XII grossly intact. Deep tendon reflexes were symmetric. Motor and sensory examination is nonfocal.  PSYCHIATRIC: Unable to be performed due to the patient's lethargy and decreased coherence.   LABORATORY, DIAGNOSTIC AND RADIOLOGIC DATA: EKG reveals bradycardia at 42 beats per minute with no acute ischemic changes.   Chest x-ray revealed congestive heart failure with pulmonary edema. White count was 6.9 with a hemoglobin 9.1. Glucose was 158 with  a BUN of 41, creatinine 1.94 with a GFR of 22, magnesium was 1.4. Her troponin was less than 0.02.   ASSESSMENT: 1.  Bradycardia.  2.  Acute systolic congestive heart failure.  3.  Acute respiratory failure secondary to #2.  4.  Anemia of chronic disease.  5.  Atherosclerotic cardiovascular disease.  6.  History of  pulmonary embolism.  7.  History of breast cancer.   PLAN: The patient will be admitted to the Intensive Care Unit, started on IV fluids for pressure support. We will begin dopamine if needed. We will consult cardiology urgently for pacemaker implant. We will hold her beta blocker and diltiazem. We will follow cardiac enzymes. Supplement oxygen at this time and wean as tolerated. We will continue anticoagulation with Coumadin. We will complete her course of antibiotic started during her last hospitalization. Follow up chest x-ray and labs in the morning. Further treatment and evaluation will depend upon the patient's progress.   TOTAL TIME SPENT ON THIS PATIENT: 50 minutes.     ____________________________ Leonie Douglas Doy Hutching, MD jds:cc D: 01/08/2013 16:08:05 ET T: 01/08/2013 16:15:46 ET JOB#: [id number]  cc: Leonie Douglas. Doy Hutching, MD, <Dictator> Cletis Athens, MD Essica Kiker Lennice Sites MD ELECTRONICALLY SIGNED 01/08/2013 21:44

## 2014-12-06 NOTE — Consult Note (Signed)
PATIENT NAME:  [name], [name] MR#:  [id number] DATE OF BIRTH:  02-22-1920  DATE OF CONSULTATION:    CONSULTING PHYSICIAN:  Corky DownsJaved Lyndell Allaire, MD  HISTORY OF PRESENT ILLNESS: Linda StanfordVirginia [name] is a 81 year old female who was admitted into the hospital with atrial fibrillation with rapid ventricular response. She was seen in the Emergency Room and admitted into the hospital. The patient had a recent trip to [place]. She says that she has been taking her medication, but has been under a lot of stress due to sickness of her son-in-law in [place]. She did not want to come back, but she had to come to her house. One of her sons lives in [place] and she cannot go live with him because the heights in [place] will increase her chances of going into congestive heart failure.   PAST MEDICAL HISTORY:  1.  Pulmonary hypertension.  2.  Coronary artery disease with stent.  3.  Status post right mastectomy.  4.  History of pulmonary embolism in the past.  5.  Atrial fibrillation with varying ventricular response.   PAST SURGICAL HISTORY: Bilateral hip replacement, breast cancer status post mastectomy.   SOCIAL HISTORY: She does not smoke or drink.   REVIEW OF SYSTEMS: As per chart.   ALLERGIES: SPIRIVA.  MEDICATIONS: As per chart.   PHYSICAL EXAMINATION: GENERAL: The patient looked weak, exhausted. Her oxygen level is low on walking. Coordination is normal.  NECK: There is no bruit in the neck.  CHEST: Decreased breath sounds bilaterally.  ABDOMEN: Soft, nontender, without any hepatosplenomegaly. Bowel sounds are audible.  EXTREMITIES: There is no pedal edema. No calf tenderness.   LABORATORY, DIAGNOSTIC AND RADIOLOGICAL DATA: Chest x-ray revealed basilar atelectasis. Glucose 117, creatinine 1.61, GFR 32 and 28, alkaline phosphatase 180. Troponin negative. TSH 1.72. Pro time 18.6. Electrocardiogram revealed atrial fibrillation with varying ventricular response.   IMPRESSION: Atrial  fibrillation with rapid ventricular response. Renal failure, chronic with acute exacerbation. Hypertension due to atrial fibrillation and dehydration. Atypical chest pain. Remote history of pulmonary embolus. Pulmonary hypertension, dilated right ventricle and dilated right atrium and left atrium, hypertrophic left ventricle with reduced ejection fraction of 40% to 45%. Low magnesium level.  RECOMMENDATIONS: Suggest to control the rate. Keep her anticoagulated with Coumadin. With acute exacerbation of congestive heart failure, sometimes her pro time becomes supratherapeutic, so it has to be checked at least twice a month.   I suggest that she should go to rehab for to 2 to 3 weeks and if she is able to be functional, she can come back home, but she still will need assistance with somebody to do her household work, cooking, cleaning and help her to dress.   ____________________________ Corky DownsJaved Kamylle Axelson, MD jm:jm D: 01/03/2013 15:41:18 ET T: 01/03/2013 16:52:20 ET JOB#: [id number]  cc: Corky DownsJaved Alphons Burgert, MD, <Dictator> Corky DownsJAVED Estelita Iten MD ELECTRONICALLY SIGNED 01/29/2013 13:06

## 2014-12-06 NOTE — Discharge Summary (Signed)
PATIENT NAME:  [name], [name] MR#:  [id number] DATE OF BIRTH:  1919-12-01  DATE OF ADMISSION:  06/28/2013 DATE OF DISCHARGE:  06/30/2013  PRESENTING COMPLAINT: Fall and hip pain.   DISCHARGE DIAGNOSES: 1. Pubic ramus fracture of the right side, secondary to a mechanical fall.  2. Abnormal CT scan with bony destructive process seen on CT of pelvic and lumbar spine. The patient does not want any further work-up.  3. History of atrial fibrillation on Coumadin.  4. Hyperlipidemia.  5. Chronic kidney disease stage III.  6. Chronic anemia.   CONDITION ON DISCHARGE: Fair.   CODE STATUS: NO CODE, DO NOT RESUSCITATE.   Home health PT has been arranged.   MEDICATIONS:  1. Simvastatin 20 mg daily.  2. Klor-Con 20 mEq p.o. daily.  3. Diltiazem 120 mg p.o. extended release daily.  4. Warfarin 2 mg daily.  5. Lasix 20 mg daily.  6. Acetaminophen/hydrocodone 325/7.5  p.o. b.i.d. is needed.  7. Tylenol 325 mg 2 tablets every four hours as needed.   DISCHARGE INSTRUCTIONS: 1. Follow up with Dr. Lavera Guise in one week.  2. Continue physical therapy.  3. Get PT/INR checked in the next visit with Dr. Lavera Guise.   ORTHOPEDIC CONSULTATION: Dr. Rudene Christians.   LABORATORY: PT-INR is 22 and 2.0. Glucose is 136, BUN 34, creatinine is 1.18. Sodium is 138, potassium is 4.4, chloride is 107, bicarbonate is 25, calcium is 9.5. H and H is 9.1 and 27.3.   CT pelvis without contrast shows severe and diffuse destructive bony process involving the entire lumbar spine. Visualize upper pelvis and visualize ribs. This could reflect diffuse metastatic disease or multiple myeloma.   CT lumbar spine without contrast shows severe diffuse destructive process involving the entire pelvis. No obvious pathology fracture or extraosseous tumor. This could reflect diffuse metastatic disease or multiple myeloma. Intact bilateral total hip arthroplasties. Right inferior pubic ramus fracture may be related to recent fall given history of  patient's right hip pain.   BRIEF SUMMARY OF HOSPITAL COURSE: [name] is a very pleasant 81 year old Caucasian female with history of chronic atrial fibrillation on Coumadin and hyperlipidemia, hypertension and history of breast cancer, comes due to right hip pain after a fall at home. The patient was admitted with:  1. Pubic ramus fracture of the right due to recent fall. The patient also has underlying bony destructive process given CT scan results. Pain control was started. The patient tolerated Tylenol well. She did not need any narcotics. She was seen by PT  who  recommended home PT, which has been set up. Orthopedics saw the patient. No acute surgical intervention at this time.  2. Abnormal CT with severe diffuse destructive process in the entire pelvis along with  lumbar spine. The patient has history of breast cancer. This could be metastatic cancer versus multiple myeloma. She was seen by Dr. Kallie Edward from oncology. The patient is aware of these lesions and does not want any further work-up or intervention.  3. History of atrial fibrillation, rate controlled on Coumadin. Continued Cardizem. I discussed the patient to discuss with Dr. Lavera Guise about possibly coming off Coumadin due to a recent fall and possible risk of fall in future. The patient wants to continue Coumadin for now and will discuss with Dr. Lavera Guise at a later date.  4. Hyperlipidemia. On simvastatin.  5. Chronic anemia. Hemoglobin remained stable.   Hospital stay otherwise remained stable.   CODE STATUS: THE PATIENT REMAINED A FULL CODE.   TIME SPENT:  A 40 minutes.  ____________________________ Hart Rochester Posey Pronto, MD sap:sg D: 07/01/2013 06:52:00 ET T: 07/01/2013 07:47:16 ET JOB#: [id number]  cc: Cletis Athens, MD Laurene Footman, MD Rae Halsted Kallie Edward, MD Valyncia Wiens A. Posey Pronto, MD, <Dictator>       Ilda Basset MD ELECTRONICALLY SIGNED 07/09/2013 14:13

## 2014-12-06 NOTE — H&P (Signed)
PATIENT NAME:  [name], [name] MR#:  [id number] DATE OF BIRTH:  [birth year]  DATE OF ADMISSION:  06/27/2013  REFERRING PHYSICIAN: Dr. Robet Leu.   PRIMARY CARE PHYSICIAN: Dr. Lavera Guise.   CHIEF COMPLAINT: Right hip pain.   HISTORY OF PRESENT ILLNESS: This is a 81 year old Caucasian female with past medical history of systolic congestive heart failure, recent permanent pacemaker placement, COPD, Afib on warfarin therapy, as well as history of breast cancer and long-standing back pain since at least 2011 who is presenting with right hip pain. She suffered a mechanical fall approximately 2 weeks ago and since that time, she has been having progressively worsening right-sided hip pain which she describes as sharp, 9 out of 10 in intensity, nonradiating and worsened with walking. She has found no relieving factors. She is now to the point where she is unable to ambulate without severe pain. At that time, she decided to present to [facility] for further workup and evaluation. CT of the pelvis revealed a right inferior pelvic ramus fracture and diffuse bony destruction concerning for metastatic disease versus multiple myeloma. The patient is currently complaining of pain which has decreased with IV pain medications. She has been on p.o. medications at home without improvement.   REVIEW OF SYSTEMS:  CONSTITUTIONAL: Denies fever, fatigue, weakness. Positive for weight loss which is intentional. She has lost approximately 26 pounds over the last 6 months.  EYES: Denies blurred vision, double vision, eye pain.  EARS, NOSE, THROAT: Denies tinnitus, ear pain, hearing loss.  RESPIRATORY: Denies cough, wheeze, shortness of breath.  CARDIOVASCULAR: Denies chest pain, palpitations, edema.  GASTROINTESTINAL: Denies nausea, vomiting, diarrhea, abdominal pain.  GENITOURINARY: Denies dysuria, hematuria.  ENDOCRINE: Denies nocturia or thyroid problems.  HEMATOLOGY AND LYMPHATIC: Denies easy bruising or bleeding.   SKIN: Denies rash or lesions.  MUSCULOSKELETAL: Positive for lumbar back pain which is chronic in nature since 2011. Also positive for right hip pain as described above.  NEUROLOGIC: Denies any paralysis or paresthesias.  PSYCHIATRIC: Denies any anxiety or depressive symptoms.   Otherwise, full review of systems performed by me is negative.   PAST MEDICAL HISTORY: Systolic congestive heart failure, COPD, history of coronary artery disease status post PCI with stenting, atrial fibrillation on warfarin therapy, permanent pacemaker placement, history of breast cancer status post mastectomy approximately 18 years ago. She had been poor with followup since that time.   SOCIAL HISTORY: Negative for tobacco, alcohol or drug usage.   FAMILY HISTORY: Positive for coronary artery disease and breast cancer.   PHYSICAL EXAMINATION:  VITAL SIGNS: Temperature 97.7, heart rate 107, respirations 18, blood pressure 122/67, saturating 97% on supplemental O2. Weight 64 kg, BMI 26.7.  GENERAL: Well-nourished, well-developed, Caucasian female who is currently in minimal distress secondary to pain.  HEAD: Normocephalic, atraumatic.  EYES: Pupils equal, round and reactive to light. Extraocular muscles intact. No scleral icterus.  MOUTH: Moist mucosal membranes. Dentition intact. No abscesses noted.  EARS, NOSE, THROAT: Throat clear without exudates. No external lesions.  NECK: Supple. No thyromegaly. No nodules. No JVD.  PULMONARY: Clear to auscultation bilaterally. No wheezes, rubs, rhonchi. No use of accessory muscles. Good respiratory effort.  CHEST: Nontender to palpation.  CARDIOVASCULAR: S1, S2, irregular rate, irregular rhythm. No murmurs, rubs or gallops. No edema. Pedal pulses 2+ bilaterally.  GASTROINTESTINAL: Soft, nontender, nondistended. No masses. Positive bowel sounds. No hepatosplenomegaly.  MUSCULOSKELETAL: No swelling, clubbing or edema. Range of motion is limited in the right hip secondary to  pain. She has full range  of motion distal to the hip; however, she elicits pain when doing hip flexion. She has point tenderness over the anterior portion of the right pelvis. She has no palpable spinal tenderness or paravertebral tenderness. She does have a lidocaine patch that is in place over the mid lumbar region.  NEUROLOGICAL: Cranial nerves II through XII intact. Sensation intact. Reflexes intact. Distal sensation of the lower extremities intact.  SKIN: No ulcerations, lesions, rashes or cyanosis. Skin is warm and dry. Turgor is intact.  PSYCHIATRIC: Mood and affect: The patient is anxious, awake, alert and oriented x 3. Insight and judgment are intact.   LABORATORY DATA: Sodium 136, potassium 4.5, chloride 104, bicarb 26, BUN 40, creatinine 1.47, glucose 127, calcium 9.8. WBC 6.7, hemoglobin 10.7, hematocrit 32.7, platelets 207. CT of the pelvis reveals bony destruction of the lumbar spine, pelvis and ribs that are visualized, concerning for metastatic disease versus multiple myeloma. There is also note of a right inferior pubic ramus fracture.   ASSESSMENT AND PLAN: A 81 year old Caucasian female with past medical history of systolic congestive heart failure, atrial fibrillation on warfarin therapy and breast cancer, presenting with right hip pain after a mechanical fall, found to have right pelvic fracture as well as severe bony destruction.  1. Acute right inferior pubic ramus fracture: Will provide pain control. Start a bowel regimen and get a physical therapy evaluation.  2. Diffuse bony destruction concerning for metastatic disease: The patient actually had an MRI of her lumbar spine back in 2011 which revealed concern for metastatic disease in the L5 region. I suspect that her findings today are worsening of this. Will check LFTs, and if there is large disparity between albumin and protein, will get an SPEP and UPEP to rule out myeloma. Will consult oncology. She will need outpatient followup  of this.  3. Acute kidney injury: Intravenous fluids of normal saline at 75 mL an hour. Will hold Lasix.  4. Atrial fibrillation: Check an INR. Continue warfarin. Continue Cardizem.  5. Systolic congestive heart failure: Stable. Lasix is on hold given acute kidney injury.  6. Venous thromboembolism prophylaxis: If therapeutic on INR, no indication.   The patient is DNR. Her daughter is her power of attorney.   TIME SPENT: 45 minutes.   ____________________________ Aaron Mose. Hower, MD dkh:gb D: 06/27/2013 21:10:38 ET T: 06/27/2013 21:57:16 ET JOB#: [id number]  cc: Aaron Mose. Hower, MD, <Dictator> DAVID Woodfin Ganja MD ELECTRONICALLY SIGNED 06/28/2013 0:20

## 2014-12-06 NOTE — H&P (Signed)
PATIENT NAME:  [name], [name] MR#:  [id number] DATE OF BIRTH:  July 22, 1920  DATE OF ADMISSION:  01/01/2013  PRIMARY CARE PHYSICIAN: Corky DownsJaved Masoud, MD  REFERRING PHYSICIAN: Malachy Moanevainder Goli, MD    CHIEF COMPLAINT: Chest pain, palpitations, weakness, poor oral intake for 3 to 4 days.   HISTORY OF PRESENT ILLNESS: The patient is a 81 year old Caucasian female with a history of CHF, pulmonary hypertension, CAD, status post a stent with history of a-fib who presented to the ED with the above chief complaint. The patient is alert, awake, oriented, in no acute distress. According to the patient and her power of attorney, the patient has had chest pain, palpitation, weakness and poor oral intake for the past 4 days. Chest pain is on the right side on and off, exacerbated by movement.  In addition, the patient has weakness and palpitations.  The patient went to [place] 2 weeks ago and has had poor oral intake for the past 1 week after coming back from [place], but she denies any fever or chills. No cough, sputum, or shortness of breath. No orthopnea, nocturnal dyspnea or leg edema.  He denies any calf tenderness. The patient was noted to have atrial fibrillation at 140 in the ED, was treated with Cardizem.   PAST MEDICAL HISTORY: CHF, pulmonary hypertension, CAD status post a stent, history of breast cancer status post mastectomy, history of  PE on Coumadin, history of a-fib.   PAST SURGICAL HISTORY: Bilateral hip replacement, breast cancer post mastectomy.   SOCIAL HISTORY: The patient is widow, lives by herself. Does not smoke. No alcohol drinking or illicit drugs.   FAMILY HISTORY: Positive for breast cancer in her sister and CAD in her sister, husband.   REVIEW OF SYSTEMS:  CONSTITUTIONAL: The patient denies any fever or chills. No headache but has dizziness, weakness and poor oral intake.  EYES: No double vision, blurred vision.  ENT: No postnasal drip, slurred speech or dysphagia. No  epistaxis.  CARDIOVASCULAR: Positive for chest pain, palpitations.  No orthopnea or nocturnal dyspnea. No leg edema.  PULMONARY: No cough, sputum, shortness of breath or hematemesis.  GASTROINTESTINAL: No abdominal pain, nausea, vomiting or diarrhea. No melena, black or bloody stool.  GENITOURINARY: No dysuria, hematuria or incontinence.  SKIN: No rash or jaundice.  NEUROLOGY: No syncope, loss of consciousness or seizure.  HEMATOLOGY: No easy bruising or bleeding.  ENDOCRINE: No polyuria, polydipsia, heat or cold intolerance.   ALLERGIES: SPIRIVA.   HOME MEDICATIONS: Zocor 20 mg p.o. daily, Latanoprost ophthalmic solution 1 drop to each affected eye at bedtime, Klor-Con M20, 20 mEq 1 tablet a day, Diltiazem 120 mg once a day, Coumadin 3 mg p.o. once a day, Citalopram 40 mg p.o. daily, chlorthalidone 20 mg p.o. twice a week, brimonidine ophthalmic solution 0.15% 1 drop to each eye b.i.d.   PHYSICAL EXAMINATION:  VITAL SIGNS: Temperature 96.9, blood pressure 95/1418m pulse 76, was 143, respirations 18, O2 saturation 96% on room air.  GENERAL: The patient is alert, awake, oriented, in no acute distress.  HEENT: Pupils are round, equal and reactive to light and accommodation. Dry oral mucosa. Clear oropharynx.  NECK: Supple. No JVD or carotid bruits. No lymphadenopathy. No thyromegaly.  CARDIOVASCULAR: S1 and S2 irregular rate and rhythm. No murmurs or gallops.  PULMONARY:  Bilateral air entry.  No wheezing or rales. No use of accessory muscles to breathe.  ABDOMEN: Soft. No distention or tenderness. No organomegaly. Bowel sounds present.  EXTREMITIES: No edema, clubbing or cyanosis. No calf  tenderness. Strong bilateral pedal pulses.  SKIN: No rash or jaundice.  NEUROLOGY: A and O x 3. No focal deficit. Power 5 out of 5. Sensation intact.   LABORATORY AND RADIOLOGICAL DATA:  Chest x-ray showed minimal left basilar opacity likely representing atelectasis. There is a small round lucency overlying  the lateral aspect of the left scapula.  Please refer to the radiology report for details. Possibilities would include metastatic disease.   CBC normal. PTT 38.5, CK 72, CK-MB 0.6, glucose 142, BUN 32, creatinine 2.01, sodium 136, potassium 4.6, bicarb 25, magnesium 1.5, INR 1.5. Troponin less than 0.02. TSH 4.17. EKG shows a-flutter with a 2:1 AV conduction to 138 bpm.   IMPRESSIONS: 1.  Atrial fibrillation with rapid ventricular response.  2.  Acute renal failure and dehydration.  3.  Hypotension. 4.  Palpitations, chest pain, need to rule out pulmonary embolism.  5.  History of pulmonary embolus.  6.  Hypomagnesemia.  7.  Pulmonary hypertension.  8.  Congestive heart failure.  9.  Coronary artery disease status post a stent.   PLAN OF TREATMENT:   1.  The patient will be admitted to a tele floor. We will give Lovenox 1 mg/kg q. 12 hours, continue Coumadin, follow up INR, continue Cardizem and also follow up troponin level, echocardiograph.  We will get a consult from Dr. Juel Burrow.   2.  Rule out pulmonary embolism. We will get a d-dimer and a V/Q scan. We cannot get a CAT scan with contrast due to acute renal failure.  3.  For acute renal failure and dehydration, we will give gentle rehydration with normal saline and follow up BMP.  4.  Hypomagnesemia. We will give magnesium and follow up level.   I discussed the patient's condition and the plan of treatment with the patient and her power of attorney, who is a nurse in our hospital.    CODE STATUS:  DNR.   TIME SPENT: About 65 minutes.   ____________________________ Shaune Pollack, MD qc:cb D: 01/01/2013 15:37:38 ET T: 01/01/2013 16:04:46 ET JOB#: [id number]  cc: Shaune Pollack, MD, <Dictator> Shaune Pollack MD ELECTRONICALLY SIGNED 01/03/2013 15:14

## 2014-12-06 NOTE — Op Note (Signed)
PATIENT NAME:  [name], Remona M MR#:  [id number] DATE OF BIRTH:  01/13/1920  DATE OF PROCEDURE:  01/11/2013  AGE: 81.   PROCEDURE: Insertion of a permanent pacemaker.   CLINICAL HISTORY: The patient has sick sinus syndrome with tachybrady arrhythmia, cardiomegaly, congestive heart failure, pulmonary hypertension.   ATTENDING PHYSICIAN: Dr. Judithann SheenSparks.    ATTENDING CARDIOLOGIST: Dr. Corky DownsJaved Madalina Rosman.   IMPLANTING PHYSICIAN: Dr. Corky DownsJaved Ocia Simek.   PACEMAKER MODEL: Adapta [id number], serial [id number] H. MA 1.3. Slow rate 1.3. Threshold 0.6. Lead serial #[id number] V. It is a lead manufactured by [employer], [phone], 52 cm.   PROCEDURE NOTE: The patient was taken to the operating room, and the left upper chest was prepared with Hibiclens. It was draped in a sterile fashion. Next, 1% Xylocaine was infiltrated into the skin and subcutaneous tissues over the deltopectoral groove. An incision was made, and the subcutaneous tissues were dissected out carefully, cauterizing all of the bleeding points and then the deltopectoral groove was entered. Fat pad was isolated, and cephalic vein was isolated. A cutdown was made in the cephalic vein, and ventricular lead was introduced in the cephalic vein, through the left subclavian vein and then into the superior vena cava. After several attempts, a good position was obtained in the floor of the right ventricle. Thresholds were found to be satisfactory. Then, the pacemaker was tied to the cephalic vein and on the top of the suture sleeve to the pectoralis major muscle. Then, it was hooked to the pacemaker, and all connections were tightened securely. Subcutaneous tissues and skin were sutured separately after cauterization of all of the bleeding points. The patient was sent to the floor in stable condition. Family was notified about the progress of the patient.   ____________________________ Corky DownsJaved Geronimo Diliberto, MD jm:gb D: 01/11/2013 15:15:50 ET T: 01/12/2013 02:36:32  ET JOB#: [id number]  cc: Corky DownsJaved Devyon Keator, MD, <Dictator> Corky DownsJAVED Amaziah Raisanen MD ELECTRONICALLY SIGNED 01/29/2013 13:09

## 2014-12-06 NOTE — Consult Note (Signed)
History of Present Illness:  Reason for Consult Possible bony metastatic lesions pelvis/spine   HPI   This is a 81 year old Caucasian female with past medical history of systolic congestive heart failure, recent permanent pacemaker placement, COPD, Afib on warfarin therapy, as well as history of breast cancer and long-standing back pain since at least 2011 who is presenting with right hip pain. She suffered a mechanical fall approximately 2 weeks ago and since that time, she has been having progressively worsening right-sided hip pain which she describes as sharp, 9 out of 10 in intensity, nonradiating and worsened with walking. She has found no relieving factors. She is now to the point where she is unable to ambulate without severe pain. On 06/27/13, she decided to present to [facility] for further workup and evaluation. CT of the pelvis revealed a right inferior pelvic ramus fracture and diffuse bony destruction concerning for metastatic disease versus multiple myeloma.  PFSH:  Family History noncontributory   Social History negative alcohol, negative tobacco   Additional Past Medical and Surgical History Systolic congestive heart failure, COPD, history of coronary artery disease status post PCI with stenting, atrial fibrillation on warfarin therapy, permanent pacemaker placement, history of breast cancer status post mastectomy approximately 18 years ago. She had been poor with followup since that time.   Review of Systems:  General pain  5/10 right pelvic area   Performance Status (ECOG) 2   HEENT no complaints   Lungs no complaints   Cardiac no complaints   GI no complaints   GU no complaints   Musculoskeletal no complaints   Extremities no complaints   Skin no complaints   Neuro no complaints   Psych no complaints   NURSING NOTES: **Vital Signs.:   14-Nov-14 05:46   Vital Signs Type: Routine   Temperature Temperature (F): 97.7   Celsius: 36.5    Temperature Source: oral   Pulse Pulse: 93   Respirations Respirations: 18   Systolic BP Systolic BP: 962   Diastolic BP (mmHg) Diastolic BP (mmHg): 76   Mean BP: 89   Pulse Ox % Pulse Ox %: 92   Pulse Ox Activity Level: At rest   Oxygen Delivery: 3L   Physical Exam:  General elderly pleasant female in no acute distress on O2   HEENT: normal   Lungs: clear   Cardiac: regular rate, rhythm   Breast: not examined   Abdomen: soft  nontender  positive bowel sounds   Skin: intact   Extremities: No edema, rash or cyanosis   Neuro: AAOx3   Psych: normal appearance   Physical Exam some bruising right LE    Spiriva: SOB, Wheezing  Radiology Results: CT:    12-Nov-14 18:25, CT Lumbar Spine Without Contrast  CT Lumbar Spine Without Contrast   REASON FOR EXAM:    pain lower lumbar pain s/p fall 1 wk ago  COMMENTS:       PROCEDURE: CT  - CT LUMBAR SPINE WO  - Jun 27 2013  6:25PM     CLINICAL DATA:  [place].  Right hip pain.    EXAM:  CT LUMBAR SPINE WITHOUT CONTRAST    TECHNIQUE:  Multidetector CT imaging of the lumbar spine was performed without  intravenous contrast administration. Multiplanar CT image  reconstructions were also generated.  COMPARISON:  None.    FINDINGS:  Severe diffuse destructive process involving the entire pelvis.  Although there is a very little normal bone I do not see an obvious  pathologic  fracture or extraosseous tumor.    There are bilateral total hip arthroplasties without obvious  complicating features. The pubic symphysis and SI joints are intact.    There is a right inferior pubic ramus fracture which may be  accounting for the patient's right hip pain.    No significant intrapelvic abnormalities. No inguinal mass or  adenopathy.   IMPRESSION:  Severe diffuse destructive process involving the entire pelvis. No  obvious pathologic fracture or extraosseous tumor. This could  reflect diffuse metastatic disease or multiple  myeloma.    Intact bilateral total hip arthroplasties.    Right inferior pubic ramus fracture may be related to recent fall  and may account for the patient's right hip pain.      Electronically Signed    By: Kalman Jewels M.D.    On: 06/27/2013 18:44     Verified By: Marlane Hatcher, M.D.,   Assessment and Plan: Impression:   81 year old female with metatstic bony lesions multiple myeloma vs metatstic process Plan:   patient had seen Dr Inez Pilgrim at [facility] in 2011 and had had lesions in spine and pelvis at that time. She refused any intervention at that time and after my discussion with her today again refused any active management of  possible myeloma.declines bone marrow biopsy and says she does not wish to be treated for any kind of cancer if this were to be cancer.is doing well on tylenol for pain and physical therapy and will be discharged per medicine team home with home health.does not wish to follow up with me in clinic.  Electronic Signatures: Georges Mouse (MD)  (Signed [id number] 14:28)  Authored: HISTORY OF PRESENT ILLNESS, PFSH, ROS, NURSING NOTES, PE, ALLERGIES, OTHER RESULTS, ASSESSMENT AND PLAN   Last Updated: 14-Nov-14 14:28 by Georges Mouse (MD)

## 2014-12-06 NOTE — Discharge Summary (Signed)
PATIENT NAME:  [name], [name] MR#:  [id number] DATE OF BIRTH:  01/19/1920  DATE OF ADMISSION:  01/01/2013 DATE OF DISCHARGE: 01/05/2013    REASON FOR ADMISSION: Chest pain, palpitations, weakness, poor oral intake for 3 to 4 days.   HISTORY AND HOSPITAL COURSE: Please refer to history of present illness by Dr. Imogene Burn.   Apparently, the patient is a 81 year old female who presented with a chronic history of CHF, pulmonary hypertension, CAD, who has had a stent in the past, history of chronic atrial fibrillation. The patient started having some chest pain, palpitations and weakness, and for 4 days, she was having very poor food intake.   Chest pain was on the right side and exacerbated by movement. The patient had a recent trip to [place] a couple of weeks ago and did not have any problems coming back up. She was found to be in atrial fibrillation with heart rate of 140, and she was treated with Cardizem. She was also found to have her INR of 1.5 despite the fact that she has been on Coumadin, for which she was put on Lovenox. On the 20th, the patient underwent a V/Q scan to rule out PE. The findings are felt to be most compatible with low to intermediate probability for pulmonary embolism. At this moment, the patient is anticoagulated with Lovenox, for which, despite the fact that it is low probability, she is covered. At this moment, we do not think that she had a PE. She also had no evidence of DVTs in the lower extremities.   Her chest x-ray showed findings consistent with CHF with mild interstitial edema and increased density in the right mid lower lung that suggested possible development of pneumonia. The patient was started on ceftriaxone and azithromycin, and now she is switched to Augmentin to complete another 8 days of antibiotics.   The patient overall did well with this hospitalization, but yesterday she was still having increase of her heart rate into the 140s. Dr. Juel Burrow ordered to add  on metoprolol twice daily, and her heart rate is in the 80s right now. I spoke directly with Dr. Juel Burrow as far as some changes noticed on her echocardiogram. Apparently, her echocardiogram is described as the patient having an MI of the lower pole of the heart, of the base of the heart, but discussed with Dr. Juel Burrow that the dictation is not correct. It might be referring to hypokinesis which is older. She was found to have an ejection fraction of 40% to 45%. At this moment, based on her blood pressure, which is 90s over 60s up to 116/66, we are not going to add on an ACE inhibitor, although the patient might benefit from one despite the fact that her failure is systolic, above 40 on ejection fraction, but she might need it anyway in the near future, but again, we are not starting it because of low blood pressure, the patient not able to tolerate that as the patient has been put on a beta blocker anyway. Other than that, the patient did well during this hospitalization.   DISCHARGE DIAGNOSES:  1. Atrial fibrillation with rapid ventricular response.  2. Congestive heart failure, diastolic and systolic failure, ejection fraction of 40% to 45%.  3. Elevated D-dimer, mildly elevated, low probability for pulmonary embolism. The patient is anticoagulated anyway.  4. Chronic anticoagulation due to atrial fibrillation.  5. Acute kidney failure, now improved.  6. Urinary tract infection.  7. Pneumonia, community acquired, diagnosed within 3 days  of hospitalization.  8. Chest pain with palpitations due to atrial fibrillation.   MEDICATIONS AT DISCHARGE:  1. Simvastatin 20 mg once daily.  2. Citalopram 40 mg once daily. 3. Klor-Con 20 mEq once a day.  4. Latanoprost 1 drop at bedtime. 5. Brimonidine ophthalmic solution twice daily.  6. Lovenox 65 mg subcutaneously every 24 hours, stop when INR above 2.0. The dose of Lovenox is a smaller dose as the patient is fragile, so she is only on 65 mg every 24 hours  instead of twice daily. This is the correct dose of 65 mg subcutaneously every 24 hours. 7. Diltiazem 180 mg capsules once a day.  8. Protonix 40 mg once a day.  9. Amoxicillin with clavulanic 875/125 mg twice daily for 8 days.  10. Metoprolol 25 mg once a day for 2 days.   FOLLOWUP: With Dr. Corky DownsJaved Masoud in 1 to 2 weeks.   TIME SPENT: I spent about 45 minutes with this discharge.   ____________________________ Felipa Furnaceoberto Sanchez Gutierrez, MD rsg:OSi D: 01/05/2013 11:25:01 ET T: 01/05/2013 11:54:52 ET JOB#: [id number]  cc: Felipa Furnaceoberto Sanchez Gutierrez, MD, <Dictator> Corky DownsJaved Masoud, MD Pearletha FurlOBERTO SANCHEZ GUTIERRE MD ELECTRONICALLY SIGNED 01/11/2013 21:29

## 2014-12-06 NOTE — Discharge Summary (Signed)
PATIENT NAME:  [name], [name] MR#:  [id number] DATE OF BIRTH:  [birth year]  DATE OF ADMISSION:  01/08/2013 DATE OF DISCHARGE:  01/15/2013  DISPOSITION:  To [facility].  DISCHARGE DIAGNOSES: 1.  Tachybrady syndrome status post pacemaker.  2.  Acute on chronic systolic heart failure.  3.  Chronic back pain.  4.  History of breast cancer.  5.  Acute on chronic renal failure, resolved.  6.  Hyperkalemia, resolved. 7.  Hypomagnesemia, resolved.   DISCHARGE MEDICATIONS: 1.  Simvastatin 20 mg p.o. daily. 2.  Celexa 40 mg p.o. daily. 3.  KCl 20 mEq p.o. daily.  4.  Brimonidine eye drops 1 drop in affected eye 2 times daily.  5.  Latanoprost ophthalmic solution 0.005% one drop in affected eye once a day. 6.  Pantoprazole 40 mg p.o. daily. 7.  Lidocaine patch to the back around 7:00 a.[name]. in the morning and remove at 7:00 p.[name]. 8.  Cardizem 180 mg p.o. daily.  9.  Ensure 1 can p.o. t.i.d.  10.  Coumadin 5 mg once a day.  PT and INR should be checked again in 1 to 2 days and adjust the Coumadin. The patient is also on Lovenox 60 mg every 12 hours sub-Q.  Please stop the Lovenox once INR is therapeutic, that is above 2.   11.  Cipro eye drops, which we started today because of possible bacterial irritation of the eye with bacterial conjunctivitis. She is on 2 drops in both eyes every 4 hours for 5 days and then stop after that.   DIET: Low-sodium.  CODE STATUS:   DO NOT RESUSCITATE. The patient decided that she wants to be DNR today.  Initially, she was a LIMITED CODE, but she told the nurse that she wants to be DNR, so we signed the paper.   HOSPITAL COURSE:  421.  A 81 year old female patient who was recently discharged for A-fib.  She was discharged on Cardizem and metoprolol. Came in because of weakness and the patient was found to be bradycardic with heart rate around 30. The patient was admitted to the ICU.  The was hypotensive on presentation as well. She was admitted to ICU, started  on dopamine drip briefly and also got atropine, 1 dose, and her metoprolol has been stopped, and cardiology consultation with Dr. Juel BurrowMasoud was obtained. The patient had pacemaker placement by Dr. Juel BurrowMasoud on 29th of May. The patient's metoprolol was stopped and she was moved to telemetry. Heart rate has been around 90s so she started back on Cardizem 180 mg daily. She is maintaining a heart rate of 80 and blood pressure of 120/70s. The patient will be on Cardizem only for her heart rate.  2.  Acute on chronic systolic heart failure. The patient's echo was done, but the official report is not in the computer, but I spoke with the technician and also Dr. Juel BurrowMasoud has read it, but he did not sign it, but EF is around 35% to 40%.  She got Lasix later on, once her blood pressure improved, because of her trouble breathing and requiring oxygen. The patient needed 100% nonrebreather and then changed to [place]. and her oxygenation is better now and she is on 2 liters of oxygen. Saturation was around 96% yesterday and it is showing 91 today, but she is not short of breath anymore.  Initial x-ray on 05/26 showed some heart failure and pulmonary edema. Repeat chest x-ray done on 05/30 did not show any pneumonia or pulmonary edema.  The patient will benefit from 2 liters of oxygen for symptom control and keeping oxygenation more than 90.  3.  Other diagnoses include acute on chronic renal failure. The patient did receive some fluids on 05/26 to 05/27, normal saline 50 mL/h and then stopped. Her renal function is improved and most recent BNP I have is showing BUN of 27 and creatinine of 1, that is on 05/30. Initially when she came her BUN was elevated at 41 and creatinine 1.94.  So that is better. The patient had also hyperkalemia with potassium 5.9. She did receive calcium gluconate and also potassium did improve. Most recent one I have is 4.2 on 05/30. 4.  Chronic back pain. Had severe back pain issues during this hospitalization  requiring morphine and Percocet.  The patient's lumbar spine x-ray did not show any fractures, but did show severe degeneration.  Started her on Lidoderm patch and she said she is much better after starting the Lidoderm patch and almost pain free.  So she would be on Lidoderm patch.  5.  Atrial fibrillation.  When she came she was on Coumadin, but because of the pacemaker procedure that was stopped and Lovenox was also stopped. The patient's INR now is 1.1. She is started back on Coumadin, but INR is still is not therapeutic. She is getting Coumadin from the last 3 days, but INR is still 1.1 so we are going to discharge her with Lovenox 1 mg/kg and Coumadin at 5 mg.  Please stop the Lovenox once INR is therapeutic at 2.  She needs PT-INR check-up again in 2 days and then stop the Lovenox if the INR goes back to above 2.  6.  History of breast cancer, stable.  7.  Hypomagnesemia. Magnesium was 1.4, which was being replaced.   CODE STATUS: DO NOT RESUSCITATE.  TIME SPENT ON DISCHARGE PREPARATION: More than 30 minutes.  ____________________________ Katha Hamming, MD sk:sb D: 01/15/2013 12:51:22 ET T: 01/15/2013 14:27:55 ET JOB#: [id number]  cc: Katha Hamming, MD, <Dictator> Corky Downs, MD Katha Hamming MD ELECTRONICALLY SIGNED 01/15/2013 22:50

## 2014-12-06 NOTE — Consult Note (Signed)
Brief Consult Note: Diagnosis: right pubic ramus fracture, extensive destructive bony lesions, spine and pelvis.   Patient was seen by consultant.   Orders entered.   Comments: activity order entered high risk for additional fractures.  Electronic Signatures: Leitha SchullerMenz, Lissy Deuser J (MD)  (Signed [id number]-Nov-14 15:15)  Authored: Brief Consult Note   Last Updated: 13-Nov-14 15:15 by Leitha SchullerMenz, Kisean Rollo J (MD)
